# Patient Record
Sex: Female | Born: 1950 | Race: Black or African American | Hispanic: No | Marital: Single
Health system: Midwestern US, Community
[De-identification: ages and names within clinical notes are randomized; demographics above are authoritative.]

## PROBLEM LIST (undated history)

## (undated) DIAGNOSIS — F419 Anxiety disorder, unspecified: Secondary | ICD-10-CM

## (undated) DIAGNOSIS — Z9289 Personal history of other medical treatment: Secondary | ICD-10-CM

## (undated) DIAGNOSIS — F329 Major depressive disorder, single episode, unspecified: Secondary | ICD-10-CM

## (undated) DIAGNOSIS — M1990 Unspecified osteoarthritis, unspecified site: Secondary | ICD-10-CM

## (undated) DIAGNOSIS — I499 Cardiac arrhythmia, unspecified: Secondary | ICD-10-CM

## (undated) DIAGNOSIS — K219 Gastro-esophageal reflux disease without esophagitis: Secondary | ICD-10-CM

## (undated) DIAGNOSIS — IMO0001 Reserved for inherently not codable concepts without codable children: Secondary | ICD-10-CM

## (undated) DIAGNOSIS — I10 Essential (primary) hypertension: Secondary | ICD-10-CM

## (undated) DIAGNOSIS — F32A Depression, unspecified: Secondary | ICD-10-CM

## (undated) DIAGNOSIS — R413 Other amnesia: Secondary | ICD-10-CM

## (undated) HISTORY — PX: CARDIAC ELECTROPHYSIOLOGY STUDY AND ABLATION: SHX1294

## (undated) HISTORY — PX: CYST EXCISION: SHX5701

---

## 2015-03-29 ENCOUNTER — Other Ambulatory Visit: Payer: Self-pay | Admitting: Neurosurgery

## 2015-03-30 ENCOUNTER — Other Ambulatory Visit: Payer: Self-pay | Admitting: Neurosurgery

## 2015-04-17 ENCOUNTER — Encounter (HOSPITAL_COMMUNITY): Payer: Self-pay

## 2015-04-17 ENCOUNTER — Encounter (HOSPITAL_COMMUNITY)
Admission: RE | Admit: 2015-04-17 | Discharge: 2015-04-17 | Disposition: A | Discharge status: Home / Self Care | Payer: Medicare Other | Source: Ambulatory Visit | Attending: Neurosurgery | Admitting: Neurosurgery

## 2015-04-17 ENCOUNTER — Other Ambulatory Visit: Payer: Self-pay

## 2015-04-17 DIAGNOSIS — R220 Localized swelling, mass and lump, head: Secondary | ICD-10-CM | POA: Insufficient documentation

## 2015-04-17 DIAGNOSIS — F17200 Nicotine dependence, unspecified, uncomplicated: Secondary | ICD-10-CM | POA: Diagnosis not present

## 2015-04-17 DIAGNOSIS — I10 Essential (primary) hypertension: Secondary | ICD-10-CM | POA: Diagnosis not present

## 2015-04-17 DIAGNOSIS — Z01812 Encounter for preprocedural laboratory examination: Secondary | ICD-10-CM | POA: Insufficient documentation

## 2015-04-17 DIAGNOSIS — Z0183 Encounter for blood typing: Secondary | ICD-10-CM | POA: Insufficient documentation

## 2015-04-17 DIAGNOSIS — K219 Gastro-esophageal reflux disease without esophagitis: Secondary | ICD-10-CM | POA: Diagnosis not present

## 2015-04-17 DIAGNOSIS — Z01818 Encounter for other preprocedural examination: Secondary | ICD-10-CM | POA: Diagnosis not present

## 2015-04-17 DIAGNOSIS — Z79899 Other long term (current) drug therapy: Secondary | ICD-10-CM | POA: Insufficient documentation

## 2015-04-17 HISTORY — DX: Anxiety disorder, unspecified: F41.9

## 2015-04-17 HISTORY — DX: Gastro-esophageal reflux disease without esophagitis: K21.9

## 2015-04-17 HISTORY — DX: Major depressive disorder, single episode, unspecified: F32.9

## 2015-04-17 HISTORY — DX: Unspecified osteoarthritis, unspecified site: M19.90

## 2015-04-17 HISTORY — DX: Essential (primary) hypertension: I10

## 2015-04-17 HISTORY — DX: Personal history of other medical treatment: Z92.89

## 2015-04-17 HISTORY — DX: Cardiac arrhythmia, unspecified: I49.9

## 2015-04-17 HISTORY — DX: Depression, unspecified: F32.A

## 2015-04-17 HISTORY — DX: Reserved for inherently not codable concepts without codable children: IMO0001

## 2015-04-17 HISTORY — DX: Other amnesia: R41.3

## 2015-04-17 LAB — CBC
HEMATOCRIT: 44.7 % (ref 36.0–46.0)
HEMOGLOBIN: 14.9 g/dL (ref 12.0–15.0)
MCH: 30.1 pg (ref 26.0–34.0)
MCHC: 33.3 g/dL (ref 30.0–36.0)
MCV: 90.3 fL (ref 78.0–100.0)
PLATELETS: 314 10*3/uL (ref 150–400)
RBC: 4.95 MIL/uL (ref 3.87–5.11)
RDW: 13.7 % (ref 11.5–15.5)
WBC: 10 10*3/uL (ref 4.0–10.5)

## 2015-04-17 LAB — BASIC METABOLIC PANEL
Anion gap: 11 (ref 5–15)
BUN: 11 mg/dL (ref 6–20)
CO2: 25 mmol/L (ref 22–32)
CREATININE: 1 mg/dL (ref 0.44–1.00)
Calcium: 9.9 mg/dL (ref 8.9–10.3)
Chloride: 101 mmol/L (ref 101–111)
GFR calc non Af Amer: 58 mL/min — ABNORMAL LOW (ref >60)
GLUCOSE: 146 mg/dL — AB (ref 65–99)
Potassium: 4 mmol/L (ref 3.5–5.1)
Sodium: 137 mmol/L (ref 135–145)

## 2015-04-17 NOTE — Progress Notes (Addendum)
[name] reports that years ago that she had  a "place in her heart burnt to stop it from racing."  "It would speed up when I am anxious, it does not do it as much or last as long now."  Patient states that she may have experienced it a couple months ago, but it last less than 30 minutes, states she does have some shortness of breath with it.   Patient states that she does not see a cardiologist and that she had not seen a Dr in years until she fell and the tumor was found.  I requested records from Dr Lillia Dallas  And [facility].

## 2015-04-17 NOTE — Pre-Procedure Instructions (Signed)
    Kathy Tucker  04/17/2015       Your procedure is scheduled on Tuesday, August 16.  Report to [facility] Admitting at 5:30 A.M.                 Your surgery is scheduled for 7:30 A.M.   Call this number if you have problems the morning of surgery: [phone]                For any other questions, please call [phone], Monday - Friday 8 AM - 4 PM.   Remember:  Do not eat food or drink liquids after midnight Monday, August 15.  Take these medicines the morning of surgery with A SIP OF WATER : amLODipine (NORVASC) .              Stop taking Aspirin, Coumadin, Plavix, Effient and Herbal medications.  Don not take any NSAIDs ie: Ibuprofen,  Advil,Naproxen or any medication containing Aspirin (Goody Headache Powder).   Do not wear jewelry, make-up or nail polish.  Do not wear lotions, powders, or perfumes.    Do not shave 48 hours prior to surgery.   Do not bring valuables to the hospital.  [facility] is not responsible for any belongings or valuables.  Contacts, dentures or bridgework may not be worn into surgery.  Leave your suitcase in the car.  After surgery it may be brought to your room.  For patients admitted to the hospital, discharge time will be determined by your treatment team.  Special instructions:  Review  [place] - Preparing For Surgery.  Please read over the following fact sheets that you were given. Pain Booklet, Coughing and Deep Breathing, Blood Transfusion Information and Surgical Site Infection Prevention

## 2015-04-18 ENCOUNTER — Other Ambulatory Visit: Payer: Self-pay | Admitting: Neurosurgery

## 2015-04-18 DIAGNOSIS — R220 Localized swelling, mass and lump, head: Principal | ICD-10-CM

## 2015-04-18 DIAGNOSIS — G9389 Other specified disorders of brain: Secondary | ICD-10-CM

## 2015-04-18 LAB — ABO/RH: ABO/RH(D): O POS

## 2015-04-18 NOTE — Progress Notes (Signed)
Anesthesia Chart Review:  Pt is 64 year old female scheduled for R stereotactic frontotemporal craniotomy on 04/25/2015 with Dr. Kathyrn Sheriff.   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 Current smoker. BMI 28.  Medications include: amlodipine, lisinopril.   EKG 04/17/2015: NSR.   Nuclear stress test 11/08/2009: -The stress test is abnormal but not diagnostic for electrocardiographic evidence of myocardial ischemia. The ST depression seen was mild and <1.0 mm which is not diagnostic for ischemia. The stress test showed a pharmacological blood pressure response. Negative for inducement of arrhythmia. Other symptoms included: dyspnea consistent with Lexiscan infusion. -No significant perfusion defects. Normal LV perfusion. LV cavity size is normal.  -LVEF 69%. Normal wall motion.   Pt reported to PAT RN she sometimes still gets palpitations, last episode a couple of months ago. Has not seen cardiology in many years. Last cardiology notes in care everywhere indicate pt had occasional palpitations after ablation and that these were thought to be PACs or PVCs related to anxiety. Pt was referred to psychiatry.   If no changes and HR acceptable DOS, I anticipate pt can proceed with surgery as scheduled.    Willeen Cass, FNP-BC [facility]/Anesthesiology Phone: [phone] 04/18/2015 4:38 PM

## 2015-04-19 ENCOUNTER — Ambulatory Visit
Admission: RE | Admit: 2015-04-19 | Discharge: 2015-04-19 | Disposition: A | Discharge status: Home / Self Care | Payer: Medicare Other | Source: Ambulatory Visit | Attending: Neurosurgery | Admitting: Neurosurgery

## 2015-04-19 DIAGNOSIS — G9389 Other specified disorders of brain: Secondary | ICD-10-CM

## 2015-04-19 DIAGNOSIS — R220 Localized swelling, mass and lump, head: Principal | ICD-10-CM

## 2015-04-19 MED ORDER — GADOBENATE DIMEGLUMINE 529 MG/ML IV SOLN
16.0000 mL | Freq: Once | INTRAVENOUS | Status: AC | PRN
  Administered 2015-04-19: 16 mL via INTRAVENOUS

## 2015-04-21 ENCOUNTER — Ambulatory Visit
Admission: RE | Admit: 2015-04-21 | Discharge: 2015-04-21 | Disposition: A | Discharge status: Home / Self Care | Payer: Medicare Other | Source: Ambulatory Visit | Attending: Neurosurgery | Admitting: Neurosurgery

## 2015-04-24 MED ORDER — CEFAZOLIN SODIUM-DEXTROSE 2-3 GM-% IV SOLR
2.0000 g | INTRAVENOUS | Status: AC
  Administered 2015-04-25: 2 g via INTRAVENOUS
  Filled 2015-04-24: qty 50

## 2015-04-25 ENCOUNTER — Inpatient Hospital Stay (HOSPITAL_COMMUNITY): Payer: Medicare Other | Admitting: Emergency Medicine

## 2015-04-25 ENCOUNTER — Encounter (HOSPITAL_COMMUNITY): Payer: Self-pay | Admitting: *Deleted

## 2015-04-25 ENCOUNTER — Encounter (HOSPITAL_COMMUNITY)
Admission: RE | Disposition: A | Discharge status: Home / Self Care | Payer: Self-pay | Source: Ambulatory Visit | Attending: Neurosurgery

## 2015-04-25 ENCOUNTER — Inpatient Hospital Stay (HOSPITAL_COMMUNITY)
Admission: RE | Admit: 2015-04-25 | Discharge: 2015-05-01 | DRG: 026 | Disposition: A | Discharge status: Home / Self Care | Payer: Medicare Other | Source: Ambulatory Visit | Attending: Neurosurgery | Admitting: Neurosurgery

## 2015-04-25 ENCOUNTER — Inpatient Hospital Stay (HOSPITAL_COMMUNITY): Payer: Medicare Other | Admitting: Certified Registered Nurse Anesthetist

## 2015-04-25 DIAGNOSIS — E232 Diabetes insipidus: Secondary | ICD-10-CM | POA: Diagnosis not present

## 2015-04-25 DIAGNOSIS — G930 Cerebral cysts: Secondary | ICD-10-CM | POA: Diagnosis present

## 2015-04-25 DIAGNOSIS — E230 Hypopituitarism: Secondary | ICD-10-CM | POA: Diagnosis not present

## 2015-04-25 DIAGNOSIS — F17210 Nicotine dependence, cigarettes, uncomplicated: Secondary | ICD-10-CM | POA: Diagnosis present

## 2015-04-25 DIAGNOSIS — Z79899 Other long term (current) drug therapy: Secondary | ICD-10-CM | POA: Diagnosis not present

## 2015-04-25 DIAGNOSIS — F329 Major depressive disorder, single episode, unspecified: Secondary | ICD-10-CM | POA: Diagnosis present

## 2015-04-25 DIAGNOSIS — E871 Hypo-osmolality and hyponatremia: Secondary | ICD-10-CM | POA: Diagnosis not present

## 2015-04-25 DIAGNOSIS — I10 Essential (primary) hypertension: Secondary | ICD-10-CM | POA: Diagnosis present

## 2015-04-25 DIAGNOSIS — R519 Headache, unspecified: Secondary | ICD-10-CM | POA: Diagnosis present

## 2015-04-25 DIAGNOSIS — Z881 Allergy status to other antibiotic agents status: Secondary | ICD-10-CM

## 2015-04-25 DIAGNOSIS — Z888 Allergy status to other drugs, medicaments and biological substances status: Secondary | ICD-10-CM

## 2015-04-25 DIAGNOSIS — D444 Neoplasm of uncertain behavior of craniopharyngeal duct: Secondary | ICD-10-CM | POA: Diagnosis present

## 2015-04-25 DIAGNOSIS — Z791 Long term (current) use of non-steroidal anti-inflammatories (NSAID): Secondary | ICD-10-CM

## 2015-04-25 DIAGNOSIS — K219 Gastro-esophageal reflux disease without esophagitis: Secondary | ICD-10-CM | POA: Diagnosis present

## 2015-04-25 DIAGNOSIS — R51 Headache: Secondary | ICD-10-CM

## 2015-04-25 DIAGNOSIS — F419 Anxiety disorder, unspecified: Secondary | ICD-10-CM | POA: Diagnosis present

## 2015-04-25 DIAGNOSIS — R42 Dizziness and giddiness: Secondary | ICD-10-CM | POA: Diagnosis present

## 2015-04-25 HISTORY — PX: CRANIOTOMY: SHX93

## 2015-04-25 HISTORY — PX: APPLICATION OF CRANIAL NAVIGATION: SHX6578

## 2015-04-25 LAB — POCT I-STAT 7, (LYTES, BLD GAS, ICA,H+H)
ACID-BASE DEFICIT: 6 mmol/L — AB (ref 0.0–2.0)
Acid-base deficit: 4 mmol/L — ABNORMAL HIGH (ref 0.0–2.0)
BICARBONATE: 20.5 meq/L (ref 20.0–24.0)
BICARBONATE: 22 meq/L (ref 20.0–24.0)
Calcium, Ion: 1.14 mmol/L (ref 1.13–1.30)
Calcium, Ion: 1.17 mmol/L (ref 1.13–1.30)
HCT: 31 % — ABNORMAL LOW (ref 36.0–46.0)
HCT: 32 % — ABNORMAL LOW (ref 36.0–46.0)
HEMOGLOBIN: 10.5 g/dL — AB (ref 12.0–15.0)
Hemoglobin: 10.9 g/dL — ABNORMAL LOW (ref 12.0–15.0)
O2 SAT: 100 %
O2 Saturation: 100 %
PH ART: 7.279 — AB (ref 7.350–7.450)
PO2 ART: 319 mmHg — AB (ref 80.0–100.0)
PO2 ART: 274 mmHg — AB (ref 80.0–100.0)
Patient temperature: 36
Potassium: 3.4 mmol/L — ABNORMAL LOW (ref 3.5–5.1)
Potassium: 3.8 mmol/L (ref 3.5–5.1)
Sodium: 140 mmol/L (ref 135–145)
Sodium: 136 mmol/L (ref 135–145)
TCO2: 22 mmol/L (ref 0–100)
TCO2: 23 mmol/L (ref 0–100)
pCO2 arterial: 43.1 mmHg (ref 35.0–45.0)
pCO2 arterial: 39.1 mmHg (ref 35.0–45.0)
pH, Arterial: 7.354 (ref 7.350–7.450)

## 2015-04-25 LAB — POCT I-STAT 4, (NA,K, GLUC, HGB,HCT)
GLUCOSE: 139 mg/dL — AB (ref 65–99)
HCT: 34 % — ABNORMAL LOW (ref 36.0–46.0)
HEMOGLOBIN: 11.6 g/dL — AB (ref 12.0–15.0)
POTASSIUM: 4.3 mmol/L (ref 3.5–5.1)
Sodium: 139 mmol/L (ref 135–145)

## 2015-04-25 LAB — PREPARE RBC (CROSSMATCH)

## 2015-04-25 LAB — MRSA PCR SCREENING: MRSA BY PCR: NEGATIVE

## 2015-04-25 SURGERY — CRANIOTOMY TUMOR EXCISION
Anesthesia: General | Laterality: Right

## 2015-04-25 MED ORDER — SODIUM CHLORIDE 0.9 % IV SOLN
3000.0000 ug | INTRAVENOUS | Status: DC | PRN
  Administered 2015-04-25: .25 ug/kg/min via INTRAVENOUS

## 2015-04-25 MED ORDER — HEMOSTATIC AGENTS (NO CHARGE) OPTIME
TOPICAL | Status: DC | PRN
  Administered 2015-04-25: 1 via TOPICAL

## 2015-04-25 MED ORDER — ACETAMINOPHEN 10 MG/ML IV SOLN
INTRAVENOUS | Status: AC
  Administered 2015-04-25: 1000 mg via INTRAVENOUS
  Filled 2015-04-25: qty 100

## 2015-04-25 MED ORDER — ONDANSETRON HCL 4 MG/2ML IJ SOLN
INTRAMUSCULAR | Status: DC | PRN
  Administered 2015-04-25: 4 mg via INTRAVENOUS

## 2015-04-25 MED ORDER — THROMBIN 5000 UNITS EX SOLR
CUTANEOUS | Status: DC | PRN
  Administered 2015-04-25: 09:00:00 via TOPICAL

## 2015-04-25 MED ORDER — CEFAZOLIN SODIUM 1-5 GM-% IV SOLN
1.0000 g | Freq: Three times a day (TID) | INTRAVENOUS | Status: AC
  Administered 2015-04-26: 1 g via INTRAVENOUS
  Filled 2015-04-25 (×2): qty 50

## 2015-04-25 MED ORDER — HYDROMORPHONE HCL 1 MG/ML IJ SOLN
0.2500 mg | INTRAMUSCULAR | Status: DC | PRN
  Administered 2015-04-25 (×4): 0.5 mg via INTRAVENOUS

## 2015-04-25 MED ORDER — EPHEDRINE SULFATE 50 MG/ML IJ SOLN
INTRAMUSCULAR | Status: AC
  Filled 2015-04-25: qty 1

## 2015-04-25 MED ORDER — LIDOCAINE-EPINEPHRINE 1 %-1:100000 IJ SOLN
INTRAMUSCULAR | Status: DC | PRN
  Administered 2015-04-25: 5 mL

## 2015-04-25 MED ORDER — ONDANSETRON HCL 4 MG/2ML IJ SOLN
4.0000 mg | INTRAMUSCULAR | Status: DC | PRN
  Administered 2015-04-28: 4 mg via INTRAVENOUS
  Filled 2015-04-25: qty 2

## 2015-04-25 MED ORDER — NEOSTIGMINE METHYLSULFATE 10 MG/10ML IV SOLN
INTRAVENOUS | Status: AC
  Filled 2015-04-25: qty 1

## 2015-04-25 MED ORDER — LISINOPRIL 20 MG PO TABS
20.0000 mg | ORAL_TABLET | Freq: Every day | ORAL | Status: DC
  Administered 2015-04-25 – 2015-05-01 (×7): 20 mg via ORAL
  Filled 2015-04-25 (×7): qty 1

## 2015-04-25 MED ORDER — SENNA 8.6 MG PO TABS
1.0000 | ORAL_TABLET | Freq: Two times a day (BID) | ORAL | Status: DC
  Administered 2015-04-25 – 2015-05-01 (×12): 8.6 mg via ORAL
  Filled 2015-04-25 (×14): qty 1

## 2015-04-25 MED ORDER — ONDANSETRON HCL 4 MG/2ML IJ SOLN
INTRAMUSCULAR | Status: AC
  Filled 2015-04-25: qty 2

## 2015-04-25 MED ORDER — BUPIVACAINE HCL (PF) 0.5 % IJ SOLN
INTRAMUSCULAR | Status: DC | PRN
  Administered 2015-04-25: 5 mL

## 2015-04-25 MED ORDER — FAMOTIDINE 20 MG PO TABS
20.0000 mg | ORAL_TABLET | Freq: Once | ORAL | Status: DC
  Filled 2015-04-25: qty 1

## 2015-04-25 MED ORDER — PROPOFOL 10 MG/ML IV BOLUS
INTRAVENOUS | Status: AC
  Filled 2015-04-25: qty 20

## 2015-04-25 MED ORDER — MEPERIDINE HCL 25 MG/ML IJ SOLN: 6.2500 mg | INTRAMUSCULAR | Status: DC | PRN

## 2015-04-25 MED ORDER — EPHEDRINE SULFATE 50 MG/ML IJ SOLN
INTRAMUSCULAR | Status: DC | PRN
  Administered 2015-04-25 (×2): 5 mg via INTRAVENOUS

## 2015-04-25 MED ORDER — FENTANYL CITRATE (PF) 100 MCG/2ML IJ SOLN
INTRAMUSCULAR | Status: DC | PRN
  Administered 2015-04-25: 50 ug via INTRAVENOUS

## 2015-04-25 MED ORDER — HYDROCORTISONE NA SUCCINATE PF 100 MG IJ SOLR
50.0000 mg | Freq: Two times a day (BID) | INTRAMUSCULAR | Status: AC
  Administered 2015-04-25 – 2015-04-26 (×3): 50 mg via INTRAVENOUS
  Filled 2015-04-25 (×4): qty 1

## 2015-04-25 MED ORDER — MANNITOL 20 % IV SOLN
INTRAVENOUS | Status: DC | PRN
  Administered 2015-04-25: 09:00:00 via INTRAVENOUS

## 2015-04-25 MED ORDER — PHENYLEPHRINE HCL 10 MG/ML IJ SOLN
10.0000 mg | INTRAVENOUS | Status: DC | PRN
  Administered 2015-04-25: 25 ug/min via INTRAVENOUS

## 2015-04-25 MED ORDER — SODIUM CHLORIDE 0.9 % IV SOLN
INTRAVENOUS | Status: DC | PRN
  Administered 2015-04-25: 08:00:00 via INTRAVENOUS

## 2015-04-25 MED ORDER — LABETALOL HCL 5 MG/ML IV SOLN
10.0000 mg | INTRAVENOUS | Status: DC | PRN
  Administered 2015-04-25 (×3): 20 mg via INTRAVENOUS
  Filled 2015-04-25 (×3): qty 4

## 2015-04-25 MED ORDER — 0.9 % SODIUM CHLORIDE (POUR BTL) OPTIME
TOPICAL | Status: DC | PRN
  Administered 2015-04-25 (×2): 1000 mL

## 2015-04-25 MED ORDER — NEOSTIGMINE METHYLSULFATE 10 MG/10ML IV SOLN
INTRAVENOUS | Status: DC | PRN
  Administered 2015-04-25: 3 mg via INTRAVENOUS

## 2015-04-25 MED ORDER — PHENYLEPHRINE 40 MCG/ML (10ML) SYRINGE FOR IV PUSH (FOR BLOOD PRESSURE SUPPORT)
PREFILLED_SYRINGE | INTRAVENOUS | Status: AC
  Filled 2015-04-25: qty 10

## 2015-04-25 MED ORDER — BACITRACIN 50000 UNITS IM SOLR
INTRAMUSCULAR | Status: DC | PRN
  Administered 2015-04-25: 09:00:00

## 2015-04-25 MED ORDER — DOCUSATE SODIUM 100 MG PO CAPS
100.0000 mg | ORAL_CAPSULE | Freq: Two times a day (BID) | ORAL | Status: DC
  Administered 2015-04-25 – 2015-05-01 (×12): 100 mg via ORAL
  Filled 2015-04-25 (×13): qty 1

## 2015-04-25 MED ORDER — PANTOPRAZOLE SODIUM 40 MG IV SOLR
40.0000 mg | Freq: Every day | INTRAVENOUS | Status: DC
  Administered 2015-04-25: 40 mg via INTRAVENOUS
  Filled 2015-04-25: qty 40

## 2015-04-25 MED ORDER — SODIUM CHLORIDE 0.9 % IV SOLN
1000.0000 mg | INTRAVENOUS | Status: DC | PRN
  Administered 2015-04-25: 1000 mg via INTRAVENOUS

## 2015-04-25 MED ORDER — SUCCINYLCHOLINE CHLORIDE 20 MG/ML IJ SOLN
INTRAMUSCULAR | Status: AC
  Filled 2015-04-25: qty 1

## 2015-04-25 MED ORDER — GLYCOPYRROLATE 0.2 MG/ML IJ SOLN
INTRAMUSCULAR | Status: AC
  Filled 2015-04-25: qty 2

## 2015-04-25 MED ORDER — ROCURONIUM BROMIDE 50 MG/5ML IV SOLN
INTRAVENOUS | Status: AC
  Filled 2015-04-25: qty 1

## 2015-04-25 MED ORDER — LACTATED RINGERS IV SOLN: INTRAVENOUS | Status: DC

## 2015-04-25 MED ORDER — LIDOCAINE HCL (CARDIAC) 20 MG/ML IV SOLN
INTRAVENOUS | Status: DC | PRN
  Administered 2015-04-25: 80 mg via INTRAVENOUS

## 2015-04-25 MED ORDER — MORPHINE SULFATE (PF) 2 MG/ML IV SOLN
1.0000 mg | INTRAVENOUS | Status: DC | PRN
  Administered 2015-04-25 – 2015-04-26 (×9): 2 mg via INTRAVENOUS
  Filled 2015-04-25 (×10): qty 1

## 2015-04-25 MED ORDER — SODIUM CHLORIDE 0.9 % IV SOLN
Freq: Once | INTRAVENOUS | Status: AC
  Administered 2015-04-25 (×2): via INTRAVENOUS

## 2015-04-25 MED ORDER — LABETALOL HCL 5 MG/ML IV SOLN
INTRAVENOUS | Status: DC | PRN
Start: 2015-04-25 — End: 2015-04-25
  Administered 2015-04-25 (×4): 10 mg via INTRAVENOUS

## 2015-04-25 MED ORDER — ONDANSETRON HCL 4 MG PO TABS: 4.0000 mg | ORAL_TABLET | ORAL | Status: DC | PRN

## 2015-04-25 MED ORDER — GLYCOPYRROLATE 0.2 MG/ML IJ SOLN
INTRAMUSCULAR | Status: DC | PRN
  Administered 2015-04-25: 0.4 mg via INTRAVENOUS

## 2015-04-25 MED ORDER — PROMETHAZINE HCL 25 MG PO TABS
12.5000 mg | ORAL_TABLET | ORAL | Status: DC | PRN
  Administered 2015-04-30: 12.5 mg via ORAL
  Filled 2015-04-25: qty 1

## 2015-04-25 MED ORDER — ACETAMINOPHEN 160 MG/5ML PO SOLN
960.0000 mg | Freq: Once | ORAL | Status: DC
  Filled 2015-04-25: qty 30

## 2015-04-25 MED ORDER — MORPHINE SULFATE (PF) 2 MG/ML IV SOLN
2.0000 mg | Freq: Once | INTRAVENOUS | Status: AC
Start: 2015-04-25 — End: 2015-04-25
  Administered 2015-04-25: 2 mg via INTRAVENOUS

## 2015-04-25 MED ORDER — MECLIZINE HCL 12.5 MG PO TABS
12.5000 mg | ORAL_TABLET | Freq: Once | ORAL | Status: AC
  Administered 2015-04-25: 12.5 mg via ORAL
  Filled 2015-04-25: qty 1

## 2015-04-25 MED ORDER — FENTANYL CITRATE (PF) 250 MCG/5ML IJ SOLN
INTRAMUSCULAR | Status: AC
  Filled 2015-04-25: qty 5

## 2015-04-25 MED ORDER — HYDROMORPHONE HCL 1 MG/ML IJ SOLN
INTRAMUSCULAR | Status: AC
  Filled 2015-04-25: qty 1

## 2015-04-25 MED ORDER — BISACODYL 10 MG RE SUPP: 10.0000 mg | Freq: Every day | RECTAL | Status: DC | PRN

## 2015-04-25 MED ORDER — ROCURONIUM BROMIDE 50 MG/5ML IV SOLN
INTRAVENOUS | Status: AC
  Filled 2015-04-25: qty 2

## 2015-04-25 MED ORDER — SODIUM CHLORIDE 0.9 % IV SOLN
500.0000 mg | Freq: Two times a day (BID) | INTRAVENOUS | Status: DC
  Administered 2015-04-25 – 2015-04-27 (×4): 500 mg via INTRAVENOUS
  Filled 2015-04-25 (×5): qty 5

## 2015-04-25 MED ORDER — ROCURONIUM BROMIDE 100 MG/10ML IV SOLN
INTRAVENOUS | Status: DC | PRN
  Administered 2015-04-25 (×3): 20 mg via INTRAVENOUS
  Administered 2015-04-25: 10 mg via INTRAVENOUS
  Administered 2015-04-25: 40 mg via INTRAVENOUS
  Administered 2015-04-25: 10 mg via INTRAVENOUS
  Administered 2015-04-25: 20 mg via INTRAVENOUS

## 2015-04-25 MED ORDER — PROMETHAZINE HCL 25 MG/ML IJ SOLN: 6.2500 mg | INTRAMUSCULAR | Status: DC | PRN

## 2015-04-25 MED ORDER — THROMBIN 20000 UNITS EX SOLR
CUTANEOUS | Status: DC | PRN
  Administered 2015-04-25: 09:00:00 via TOPICAL

## 2015-04-25 MED ORDER — HYDROCODONE-ACETAMINOPHEN 5-325 MG PO TABS
1.0000 | ORAL_TABLET | ORAL | Status: DC | PRN
  Administered 2015-04-25 – 2015-04-30 (×15): 1 via ORAL
  Filled 2015-04-25 (×15): qty 1

## 2015-04-25 MED ORDER — AMLODIPINE BESYLATE 5 MG PO TABS
5.0000 mg | ORAL_TABLET | Freq: Every day | ORAL | Status: DC
  Administered 2015-04-25 – 2015-04-28 (×4): 5 mg via ORAL
  Filled 2015-04-25 (×4): qty 1

## 2015-04-25 MED ORDER — BACITRACIN ZINC 500 UNIT/GM EX OINT
TOPICAL_OINTMENT | CUTANEOUS | Status: DC | PRN
  Administered 2015-04-25 (×2): 1 via TOPICAL

## 2015-04-25 MED ORDER — PROPOFOL 10 MG/ML IV BOLUS
INTRAVENOUS | Status: DC | PRN
  Administered 2015-04-25: 200 mg via INTRAVENOUS

## 2015-04-25 MED ORDER — HYDRALAZINE HCL 20 MG/ML IJ SOLN
INTRAMUSCULAR | Status: AC
  Filled 2015-04-25: qty 1

## 2015-04-25 MED ORDER — ACETAMINOPHEN 500 MG PO TABS: 1000.0000 mg | ORAL_TABLET | Freq: Once | ORAL | Status: DC

## 2015-04-25 MED ORDER — NICARDIPINE HCL IN NACL 20-0.86 MG/200ML-% IV SOLN
3.0000 mg/h | INTRAVENOUS | Status: DC
  Administered 2015-04-25 (×2): 7.5 mg/h via INTRAVENOUS
  Administered 2015-04-25: 5 mg/h via INTRAVENOUS
  Administered 2015-04-26: 10 mg/h via INTRAVENOUS
  Administered 2015-04-26: 7.5 mg/h via INTRAVENOUS
  Administered 2015-04-26: 10 mg/h via INTRAVENOUS
  Filled 2015-04-25 (×7): qty 200

## 2015-04-25 MED ORDER — SODIUM CHLORIDE 0.9 % IV SOLN
INTRAVENOUS | Status: DC
  Administered 2015-04-25 – 2015-04-26 (×2): via INTRAVENOUS

## 2015-04-25 MED ORDER — DEXAMETHASONE SODIUM PHOSPHATE 10 MG/ML IJ SOLN
INTRAMUSCULAR | Status: DC | PRN
  Administered 2015-04-25: 10 mg via INTRAVENOUS

## 2015-04-25 MED ORDER — LIDOCAINE HCL (CARDIAC) 20 MG/ML IV SOLN
INTRAVENOUS | Status: AC
  Filled 2015-04-25: qty 5

## 2015-04-25 MED ORDER — MORPHINE SULFATE (PF) 2 MG/ML IV SOLN
INTRAVENOUS | Status: AC
  Filled 2015-04-25: qty 1

## 2015-04-25 MED ORDER — HYDRALAZINE HCL 20 MG/ML IJ SOLN
10.0000 mg | INTRAMUSCULAR | Status: DC
  Administered 2015-04-25 (×2): 10 mg via INTRAVENOUS

## 2015-04-25 MED ORDER — SODIUM CHLORIDE 0.9 % IJ SOLN
INTRAMUSCULAR | Status: AC
  Filled 2015-04-25: qty 10

## 2015-04-25 SURGICAL SUPPLY — 106 items
BAG DECANTER FOR FLEXI CONT (MISCELLANEOUS) ×3 IMPLANT
BANDAGE GAUZE 4  KLING STR (GAUZE/BANDAGES/DRESSINGS) ×3 IMPLANT
BATTERY IQ STERILE (MISCELLANEOUS) ×3 IMPLANT
BENZOIN TINCTURE PRP APPL 2/3 (GAUZE/BANDAGES/DRESSINGS) IMPLANT
BLADE CLIPPER SURG (BLADE) ×6 IMPLANT
BLADE SAW GIGLI 16 STRL (MISCELLANEOUS) IMPLANT
BLADE SURG 15 STRL LF DISP TIS (BLADE) ×1 IMPLANT
BLADE SURG 15 STRL SS (BLADE) ×2
BLADE ULTRA TIP 2M (BLADE) ×3 IMPLANT
BNDG GAUZE ELAST 4 BULKY (GAUZE/BANDAGES/DRESSINGS) ×3 IMPLANT
BRUSH SCRUB EZ 1% IODOPHOR (MISCELLANEOUS) IMPLANT
BUR ACORN 6.0 PRECISION (BURR) ×2 IMPLANT
BUR ACORN 6.0MM PRECISION (BURR) ×1
BUR ADDG 1.1 (BURR) IMPLANT
BUR ADDG 1.1MM (BURR)
BUR MATCHSTICK NEURO 3.0 LAGG (BURR) IMPLANT
BUR ROUND FLUTED 4 SOFT TCH (BURR) ×2 IMPLANT
BUR ROUND FLUTED 4MM SOFT TCH (BURR) ×1
BUR SPIRAL ROUTER 2.3 (BUR) ×2 IMPLANT
BUR SPIRAL ROUTER 2.3MM (BUR) ×1
CANISTER SUCT 3000ML PPV (MISCELLANEOUS) ×6 IMPLANT
CATH VENTRIC 35X38 W/TROCAR LG (CATHETERS) IMPLANT
CLIP TI MEDIUM 6 (CLIP) IMPLANT
CONT SPEC 4OZ CLIKSEAL STRL BL (MISCELLANEOUS) ×6 IMPLANT
COVER MAYO STAND STRL (DRAPES) IMPLANT
DECANTER SPIKE VIAL GLASS SM (MISCELLANEOUS) ×3 IMPLANT
DRAIN SNY WOU 7FLT (WOUND CARE) IMPLANT
DRAIN SUBARACHNOID (WOUND CARE) IMPLANT
DRAPE MICROSCOPE LEICA (MISCELLANEOUS) ×6 IMPLANT
DRAPE NEUROLOGICAL W/INCISE (DRAPES) ×3 IMPLANT
DRAPE PROXIMA HALF (DRAPES) ×3 IMPLANT
DRAPE STERI IOBAN 125X83 (DRAPES) IMPLANT
DRAPE SURG 17X23 STRL (DRAPES) IMPLANT
DRAPE WARM FLUID 44X44 (DRAPE) ×3 IMPLANT
DRSG ADAPTIC 3X8 NADH LF (GAUZE/BANDAGES/DRESSINGS) ×3 IMPLANT
DRSG TELFA 3X8 NADH (GAUZE/BANDAGES/DRESSINGS) ×3 IMPLANT
DURAMATRIX ONLAY 3X3 (Plate) ×3 IMPLANT
DURAPREP 6ML APPLICATOR 50/CS (WOUND CARE) ×3 IMPLANT
ELECT CAUTERY BLADE 6.4 (BLADE) ×3 IMPLANT
ELECT REM PT RETURN 9FT ADLT (ELECTROSURGICAL) ×3
ELECTRODE REM PT RTRN 9FT ADLT (ELECTROSURGICAL) ×1 IMPLANT
EVACUATOR 1/8 PVC DRAIN (DRAIN) IMPLANT
EVACUATOR SILICONE 100CC (DRAIN) IMPLANT
FORCEPS BIPOLAR SPETZLER 8 1.0 (NEUROSURGERY SUPPLIES) ×3 IMPLANT
GAUZE SPONGE 4X4 12PLY STRL (GAUZE/BANDAGES/DRESSINGS) ×3 IMPLANT
GAUZE SPONGE 4X4 16PLY XRAY LF (GAUZE/BANDAGES/DRESSINGS) IMPLANT
GLOVE ECLIPSE 6.5 STRL STRAW (GLOVE) ×3 IMPLANT
GLOVE ECLIPSE 7.5 STRL STRAW (GLOVE) IMPLANT
GLOVE ECLIPSE 9.0 STRL (GLOVE) ×3 IMPLANT
GLOVE EXAM NITRILE LRG STRL (GLOVE) IMPLANT
GLOVE EXAM NITRILE MD LF STRL (GLOVE) IMPLANT
GLOVE EXAM NITRILE XL STR (GLOVE) IMPLANT
GLOVE EXAM NITRILE XS STR PU (GLOVE) IMPLANT
GOWN STRL REUS W/ TWL LRG LVL3 (GOWN DISPOSABLE) ×2 IMPLANT
GOWN STRL REUS W/ TWL XL LVL3 (GOWN DISPOSABLE) ×3 IMPLANT
GOWN STRL REUS W/TWL 2XL LVL3 (GOWN DISPOSABLE) IMPLANT
GOWN STRL REUS W/TWL LRG LVL3 (GOWN DISPOSABLE) ×4
GOWN STRL REUS W/TWL XL LVL3 (GOWN DISPOSABLE) ×6
HEMOSTAT POWDER KIT SURGIFOAM (HEMOSTASIS) IMPLANT
HEMOSTAT POWDER SURGIFOAM 1G (HEMOSTASIS) ×3 IMPLANT
HEMOSTAT SURGICEL 2X14 (HEMOSTASIS) IMPLANT
KIT BASIN OR (CUSTOM PROCEDURE TRAY) ×3 IMPLANT
KIT DRAIN CSF ACCUDRAIN (MISCELLANEOUS) IMPLANT
KIT ROOM TURNOVER OR (KITS) ×3 IMPLANT
KNIFE ARACHNOID DISP AM-24-S (MISCELLANEOUS) ×3 IMPLANT
MARKER SPHERE PSV REFLC 13MM (MARKER) ×12 IMPLANT
NEEDLE HYPO 25X1 1.5 SAFETY (NEEDLE) ×3 IMPLANT
NEEDLE SPNL 18GX3.5 QUINCKE PK (NEEDLE) IMPLANT
NS IRRIG 1000ML POUR BTL (IV SOLUTION) ×6 IMPLANT
PACK CRANIOTOMY (CUSTOM PROCEDURE TRAY) ×3 IMPLANT
PAD EYE OVAL STERILE LF (GAUZE/BANDAGES/DRESSINGS) IMPLANT
PATTIES SURGICAL .25X.25 (GAUZE/BANDAGES/DRESSINGS) ×3 IMPLANT
PATTIES SURGICAL .5 X.5 (GAUZE/BANDAGES/DRESSINGS) ×6 IMPLANT
PATTIES SURGICAL .5 X1 (DISPOSABLE) ×3 IMPLANT
PATTIES SURGICAL .5 X3 (DISPOSABLE) IMPLANT
PATTIES SURGICAL 1/4 X 3 (GAUZE/BANDAGES/DRESSINGS) IMPLANT
PATTIES SURGICAL 1X1 (DISPOSABLE) IMPLANT
PIN MAYFIELD SKULL DISP (PIN) ×3 IMPLANT
PLATE 1.5  2HOLE LNG NEURO (Plate) ×4 IMPLANT
PLATE 1.5 2HOLE LNG NEURO (Plate) ×2 IMPLANT
PLATE 1.5 4HOLE LONG STRAIGHT (Plate) ×3 IMPLANT
RUBBERBAND STERILE (MISCELLANEOUS) IMPLANT
SCREW SELF DRILL HT 1.5/4MM (Screw) ×18 IMPLANT
SPECIMEN JAR SMALL (MISCELLANEOUS) IMPLANT
SPONGE NEURO XRAY DETECT 1X3 (DISPOSABLE) IMPLANT
SPONGE SURGIFOAM ABS GEL 100 (HEMOSTASIS) ×3 IMPLANT
STAPLER VISISTAT 35W (STAPLE) ×6 IMPLANT
STOCKINETTE ×6 IMPLANT
STOCKINETTE 6  STRL (DRAPES)
STOCKINETTE 6 STRL (DRAPES) IMPLANT
SUT ETHILON 3 0 FSL (SUTURE) IMPLANT
SUT ETHILON 3 0 PS 1 (SUTURE) IMPLANT
SUT NURALON 4 0 TR CR/8 (SUTURE) ×6 IMPLANT
SUT SILK 0 TIES 10X30 (SUTURE) IMPLANT
SUT VIC AB 0 CT1 18XCR BRD8 (SUTURE) ×2 IMPLANT
SUT VIC AB 0 CT1 8-18 (SUTURE) ×4
SUT VIC AB 3-0 SH 8-18 (SUTURE) ×6 IMPLANT
SYR CONTROL 10ML LL (SYRINGE) ×3 IMPLANT
TIP SONASTAR STD MISONIX 1.9 (TRAY / TRAY PROCEDURE) IMPLANT
TOWEL OR 17X24 6PK STRL BLUE (TOWEL DISPOSABLE) ×3 IMPLANT
TOWEL OR 17X26 10 PK STRL BLUE (TOWEL DISPOSABLE) ×3 IMPLANT
TRAY FOLEY W/METER SILVER 14FR (SET/KITS/TRAYS/PACK) ×3 IMPLANT
TUBE CONNECTING 12'X1/4 (SUCTIONS) ×1
TUBE CONNECTING 12X1/4 (SUCTIONS) ×2 IMPLANT
UNDERPAD 30X30 INCONTINENT (UNDERPADS AND DIAPERS) ×3 IMPLANT
WATER STERILE IRR 1000ML POUR (IV SOLUTION) ×3 IMPLANT

## 2015-04-25 NOTE — Transfer of Care (Signed)
Immediate Anesthesia Transfer of Care Note  Patient: Kathy Tucker  Procedure(s) Performed: Procedure(s) with comments: Right stereotactic frontotemporal craniotomy for Resection of Tumor (Right) - Right stereotactic frontotemporal craniotomy for resection of tumor with brain lab APPLICATION OF CRANIAL NAVIGATION (Right)  Patient Location: PACU  Anesthesia Type:General  Level of Consciousness: awake, alert , oriented and patient cooperative  Airway & Oxygen Therapy: Patient Spontanous Breathing and Patient connected to face mask oxygen  Post-op Assessment: Report given to RN, Post -op Vital signs reviewed and stable and Patient moving all extremities X 4  Post vital signs: Reviewed and stable  Last Vitals:  Filed Vitals:   04/25/15 0610  BP: 153/89  Pulse: 66  Temp: 37 C  Resp: 16    Complications: No apparent anesthesia complications

## 2015-04-25 NOTE — Anesthesia Preprocedure Evaluation (Addendum)
Anesthesia Evaluation  Patient identified by MRN, date of birth, ID band Patient awake    Reviewed: Allergy & Precautions, NPO status , Patient's Chart, lab work & pertinent test results  Airway Mallampati: II  TM Distance: >3 FB Neck ROM: Full    Dental  (+) Teeth Intact, Dental Advisory Given   Pulmonary Current Smoker,  breath sounds clear to auscultation        Cardiovascular Exercise Tolerance: Good hypertension, Pt. on medications Rhythm:Regular Rate:Normal     Neuro/Psych PSYCHIATRIC DISORDERS Anxiety Depression    GI/Hepatic Neg liver ROS, GERD-  ,  Endo/Other  negative endocrine ROS  Renal/GU negative Renal ROS  negative genitourinary   Musculoskeletal  (+) Arthritis -, Osteoarthritis,    Abdominal   Peds negative pediatric ROS (+)  Hematology negative hematology ROS (+)   Anesthesia Other Findings   Reproductive/Obstetrics negative OB ROS                            Lab Results  Component Value Date   WBC 10.0 04/17/2015   HGB 14.9 04/17/2015   HCT 44.7 04/17/2015   MCV 90.3 04/17/2015   PLT 314 04/17/2015   Lab Results  Component Value Date   CREATININE 1.00 04/17/2015   BUN 11 04/17/2015   NA 137 04/17/2015   K 4.0 04/17/2015   CL 101 04/17/2015   CO2 25 04/17/2015   No results found for: INR, PROTIME  EKG: normal sinus rhythm.  Anesthesia Physical Anesthesia Plan  ASA: II  Anesthesia Plan: General   Post-op Pain Management:    Induction: Intravenous  Airway Management Planned: Oral ETT  Additional Equipment: Arterial line  Intra-op Plan:   Post-operative Plan: Extubation in OR  Informed Consent: I have reviewed the patients History and Physical, chart, labs and discussed the procedure including the risks, benefits and alternatives for the proposed anesthesia with the patient or authorized representative who has indicated his/her understanding and  acceptance.   Dental advisory given  Plan Discussed with: CRNA  Anesthesia Plan Comments:         Anesthesia Quick Evaluation

## 2015-04-25 NOTE — Op Note (Signed)
PREOP DIAGNOSIS:  1. Supraseallar tumor   POSTOP DIAGNOSIS: Same  PROCEDURE: 1. Stereotactic right frontotemporal craniotomy for resection of suprasellar tumor 2. Use of intraoperative microscope for microdissection  SURGEON: Dr. Consuella Lose, MD  ASSISTANT: Dr. Janece Canterbury, MD  ANESTHESIA: General Endotracheal  EBL: 250cc  SPECIMENS: Hypothalamic tumor for frozen/permanent pathology  DRAINS: None  COMPLICATIONS: None immediate  CONDITION: Hemodynamically stable to PACU  HISTORY: Kathy Tucker is a 64 y.o. female who initially presented to the outpatient neurosurgery clinic with nonspecific complaints of dizziness and headache, with imbalance. 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 Endocrine workup was also completed which showed mild elevation of serum prolactin, but no other endocrine abnormalities. Diagnosis was unclear, but included craniopharyngioma, cystic adenoma, and Diovan cephalic glioma. Surgical resection for decompression and diagnosis was therefore indicated. Risks and benefits of the surgery were reviewed in detail with the patient and her family, including endocrine abnormality, seizure, coma, visual loss, were all reviewed. After all questions were answered, informed consent was obtained.  PROCEDURE IN DETAIL: After informed consent was obtained and witnessed, the patient was brought to the operating room. After induction of general anesthesia, the Mayfield head holder was applied to the patient, and she was positioned on the operative table in the supine position. All pressure points were meticulously padded. Skin incision was then marked out and prepped and draped in the usual sterile fashion. Utilizing the preoperative stereotactic MRI scan, surface markers were co-registered until satisfactory accuracy was achieved.  After timeout was conducted, skin incision was infiltrated with local  and static with epinephrine. Skin incision was then made sharply, and Bovie electrocautery was used to dissect the subcutaneous tissue until the galea was identified and incised. The superficial temporal fascia, and the fat pad was then identified and incised, and skin flap was elevated after interfascial temporal dissection was completed. Skin flap was then retracted anteriorly. Temporalis fascia was then incised, and the muscle retracted inferiorly. Standard frontotemporal craniotomy was completed and elevated. Good hemostasis was achieved on the bone edges. Utilizing a 4 mm cutting bur, the lesser wing of the sphenoid was then drilled down flush with the orbital roof. At this point, the microscope was draped sterilely and brought into the field, and the remainder of the case was done under the microscope using microdissection.  The dura was opened in curvilinear fashion and retracted anteriorly. Utilizing a subfrontal approach, the right optic nerve was identified, and the optical carotid cistern was opened. Good CSF egress was obtained, and this allowed significant brain relaxation. We are then able to dissect out the carotid artery to its bifurcation, and the medial portion of the sylvian fissure was identified, and the proximal M1 was also identified. Dissection was then carried out on top of the optic nerve to identify the right A1, and this was traced medially, until the interhemispheric fissure was identified. The contralateral A1 was then identified, as was the contralateral optic nerve and internal carotid artery. Dissection was then carried out along the superior aspect of the optic chiasm. The interhemispheric fissure was then split, the right frontal lobe was slightly retracted, and the lamina terminalis was identified underneath the anterior communicating artery complex. Our position was verified utilizing the stereotactic system. The lamina terminalis was then coagulated, and a dissector was used  to fenestrate the underlying tissue. There was a gush of clear fluid which appeared to be CSF. Inspection within the cavity  appeared to be cyst wall, and we're therefore within the cystic portion of the tumor. Inferiorly, there was a pedunculated whitish-colored mass, which what appeared to be small areas of calcification. Portions of this mass were removed with pituitary rongeurs and sent for frozen pathology. Pathology did return consistent with benign squamous type tumor, consistent with craniopharyngioma. Utilizing multiple microdissectors, portions of the solid pedunculated mass were removed piecemeal utilizing rongeur The cyst wall was noted to be somewhat adherent to the Hypothalamus, and it was therefore left in place.   having completed safe resection of the enhancing portion of the tumor, the region was irrigated with copious amounts of normal saline irrigation. Good hemostasis was confirmed on the brain surface. The dura was reapproximated a combination of interrupted 4-0 Nurolon stitches. A piece of dura matrix was then placed over the dural surface. Muscle was then closed using interrupted 0 Vicryl stitches, and the galea was closed using interrupted 3-0 Vicryl sutures. The skin was closed using standard surgical skin staples. Sterile dressing was then applied after the Mayfield head holder was removed. The patient was then transferred to the stretcher and taken to thepostanesthesia care unitable hemodynamic condition.  At the end of the case all sponge, needle, cottonoid, and instrument counts were correct.

## 2015-04-25 NOTE — H&P (Signed)
CC:  Falls, dizziness  HPI: Kathy Tucker is a 64 y.o. female initially seen in the office with   imbalance and dizziness since the beginning of this year. She says that her first fall was in January, where she felt dizzy and nearly passed out, and fell hitting her left side. She did okay for several months, and had another fall a few weeks ago, and another one a few weeks after that. She says these episodes are caused by bouts of dizziness and lightheadedness. Upon questioning, she says that she is also experienced significant weight gain of about 20 or 30 pounds since the beginning of this year. She has had fairly chronic headaches, although these have not changed significantly. She has not noted any changes in her vision.   PMH: Past Medical History  Diagnosis Date  . Hypertension   . Dysrhythmia     heart rate speeds up, usually with anixety  . Shortness of breath dyspnea     with exertion  . Anxiety   . Depression   . Short-term memory loss     Past 8 months  . GERD (gastroesophageal reflux disease)     has taken Zantac- not recently  . Arthritis   . History of blood transfusion     after csection    PSH: Past Surgical History  Procedure Laterality Date  . Cesarean section      x 3  . Cyst excision Right     from hand  . Cardiac electrophysiology study and ablation      in [place] ? 2008 ish    SH: Social History  Substance Use Topics  . Smoking status: Current Every Day Smoker -- 1.00 packs/day for 44 years  . Smokeless tobacco: None  . Alcohol Use: No    MEDS: Prior to Admission medications   Medication Sig Start Date End Date Taking? Authorizing Provider  amLODipine (NORVASC) 10 MG tablet Take 5 mg by mouth daily.    Yes Historical Provider, MD  Aspirin-Acetaminophen-Caffeine (GOODY HEADACHE PO) Take 1 packet by mouth as needed (for pain).   Yes Historical Provider, MD  ibuprofen (ADVIL,MOTRIN) 400 MG tablet Take 400 mg by mouth every 6 (six) hours as  needed for headache.   Yes Historical Provider, MD  lisinopril (PRINIVIL,ZESTRIL) 20 MG tablet Take 20 mg by mouth daily.   Yes Historical Provider, MD    ALLERGY: Allergies  Allergen Reactions  . Sulfa Antibiotics Rash    Hives  . Bupropion   . Citalopram Hydrobromide   . Fluoxetine Hcl     ROS: ROS  NEUROLOGIC EXAM: Awake, alert, oriented Memory and concentration grossly intact Speech fluent, appropriate CN grossly intact Motor exam: Upper Extremities Deltoid Bicep Tricep Grip  Right 5/5 5/5 5/5 5/5  Left 5/5 5/5 5/5 5/5   Lower Extremity IP Quad PF DF EHL  Right 5/5 5/5 5/5 5/5 5/5  Left 5/5 5/5 5/5 5/5 5/5   Sensation grossly intact to LT  DIAGNOSTIC RESULTS  MRI of the brain with and without contrast dated 03/07/15 was reviewed. This demonstrates a somewhat heterogeneously enhancing mass which appears to arise from the superior aspect of the pituitary stalk, with an associated cyst which extends into the third ventricle. The wall of the cyst does enhance with contrast. Review of the CT scan does not demonstrate any calcifications of this lesion. There is no hydrocephalus.   Her serum endocrine panel was reviewed and is essentially normal, with the exception of a  slightly elevated prolactin at 57.9.  IMPRESSION: 64 year old woman with what appears to be a cystic mass of the infundibulum. Differential includes cystic lesion such as Rathke's cleft cyst, or craniopharyngioma, although I believe cystic-type glioma is also within the differential. Her elevated prolactin is likely from stalk effect.  PLAN: - proceed with right sided frontotemporal craniotomy for excisional biopsy and cyst fenestration with the goal being diagnosis, and decompression.   I think it unlikely that this tumor can be completely removed, nor can the wall be completely removed given its location in the third ventricle/hypothalamus.   I did review the MRI results with the patient and her family.  Treatment options were discussed including my recommendation for surgery to obtain a diagnosis. Risks of surgery were discussed including the risk of optic nerve injury, endocrine dysfunction, hypothalamic dysfunction which can lead to coma or death, stroke, seizure, and hydrocephalus. The goals of surgery were also discussed. After all the patient's and her family's questions were answered, they elected to proceed with surgery.

## 2015-04-25 NOTE — Anesthesia Procedure Notes (Signed)
Procedure Name: Intubation Date/Time: 04/25/2015 7:45 AM Performed by: Marye Round Pre-anesthesia Checklist: Patient identified, Emergency Drugs available, Suction available and Patient being monitored Patient Re-evaluated:Patient Re-evaluated prior to inductionOxygen Delivery Method: Circle system utilized Preoxygenation: Pre-oxygenation with 100% oxygen Intubation Type: IV induction Ventilation: Oral airway inserted - appropriate to patient size and Mask ventilation without difficulty Laryngoscope Size: Glidescope and 3 Grade View: Grade I Tube type: Oral Tube size: 7.5 mm Number of attempts: 2 Airway Equipment and Method: Stylet Placement Confirmation: ETT inserted through vocal cords under direct vision,  positive ETCO2 and breath sounds checked- equal and bilateral Secured at: 22 cm Tube secured with: Tape Dental Injury: Teeth and Oropharynx as per pre-operative assessment  Difficulty Due To: Difficulty was anticipated Comments: DLx2 with Grade 3 view by CRNA, Glidescope utilized (Grade 1 view equivalent). Patient epiglottis was anterior with small mouth opening. Easy BMV throughout. Teeth block in place throughout. Dentition unchanged.   Recommend short acting and glidescope for future intubations.   Crissie Sickles Smith Robert, MD, [facility] Anesthesiology

## 2015-04-25 NOTE — Anesthesia Postprocedure Evaluation (Signed)
  Anesthesia Post-op Note  Patient: Kathy Tucker  Procedure(s) Performed: Procedure(s) with comments: Right stereotactic frontotemporal craniotomy for Resection of Tumor (Right) - Right stereotactic frontotemporal craniotomy for resection of tumor with brain lab APPLICATION OF CRANIAL NAVIGATION (Right)  Patient Location: PACU  Anesthesia Type:General  Level of Consciousness: awake, alert  and oriented  Airway and Oxygen Therapy: Patient Spontanous Breathing  Post-op Pain: mild  Post-op Assessment: Post-op Vital signs reviewed and Patient's Cardiovascular Status Stable              Post-op Vital Signs: Reviewed and stable  Last Vitals:  Filed Vitals:   04/25/15 1313  BP: 111/61  Pulse: 75  Temp:   Resp: 16    Complications: No apparent anesthesia complications

## 2015-04-25 NOTE — Progress Notes (Signed)
Utilization review completed. Houston Zapien, RN, BSN. 

## 2015-04-26 ENCOUNTER — Inpatient Hospital Stay (HOSPITAL_COMMUNITY): Payer: Medicare Other

## 2015-04-26 ENCOUNTER — Encounter (HOSPITAL_COMMUNITY): Payer: Self-pay | Admitting: Neurosurgery

## 2015-04-26 LAB — BASIC METABOLIC PANEL WITH GFR
Anion gap: 13 (ref 5–15)
BUN: <5 mg/dL — ABNORMAL LOW (ref 6–20)
CO2: 17 mmol/L — ABNORMAL LOW (ref 22–32)
Calcium: 9 mg/dL (ref 8.9–10.3)
Chloride: 114 mmol/L — ABNORMAL HIGH (ref 101–111)
Creatinine, Ser: 0.76 mg/dL (ref 0.44–1.00)
GFR calc Af Amer: >60 mL/min
GFR calc non Af Amer: >60 mL/min
Glucose, Bld: 153 mg/dL — ABNORMAL HIGH (ref 65–99)
Potassium: 3.6 mmol/L (ref 3.5–5.1)
Sodium: 144 mmol/L (ref 135–145)

## 2015-04-26 LAB — BASIC METABOLIC PANEL
Anion gap: 12 (ref 5–15)
CALCIUM: 9.2 mg/dL (ref 8.9–10.3)
CO2: 21 mmol/L — ABNORMAL LOW (ref 22–32)
Chloride: 117 mmol/L — ABNORMAL HIGH (ref 101–111)
Creatinine, Ser: 0.73 mg/dL (ref 0.44–1.00)
GFR calc Af Amer: >60 mL/min (ref >60)
GLUCOSE: 155 mg/dL — AB (ref 65–99)
Potassium: 3.3 mmol/L — ABNORMAL LOW (ref 3.5–5.1)
Sodium: 150 mmol/L — ABNORMAL HIGH (ref 135–145)

## 2015-04-26 LAB — GLUCOSE, CAPILLARY
Glucose-Capillary: 150 mg/dL — ABNORMAL HIGH (ref 65–99)
Glucose-Capillary: 175 mg/dL — ABNORMAL HIGH (ref 65–99)

## 2015-04-26 LAB — SODIUM: Sodium: 148 mmol/L — ABNORMAL HIGH (ref 135–145)

## 2015-04-26 MED ORDER — MORPHINE SULFATE (PF) 2 MG/ML IV SOLN
1.0000 mg | INTRAVENOUS | Status: DC | PRN
  Administered 2015-04-26 – 2015-04-30 (×12): 2 mg via INTRAVENOUS
  Filled 2015-04-26 (×13): qty 1

## 2015-04-26 MED ORDER — OXYCODONE HCL 5 MG PO TABS
10.0000 mg | ORAL_TABLET | ORAL | Status: DC | PRN
  Administered 2015-04-26 – 2015-05-01 (×19): 10 mg via ORAL
  Filled 2015-04-26 (×19): qty 2

## 2015-04-26 MED ORDER — MIDAZOLAM HCL 2 MG/2ML IJ SOLN
INTRAMUSCULAR | Status: AC
  Filled 2015-04-26: qty 2

## 2015-04-26 MED ORDER — SODIUM CHLORIDE 0.45 % IV SOLN
INTRAVENOUS | Status: DC
  Administered 2015-04-26 – 2015-04-27 (×2): via INTRAVENOUS

## 2015-04-26 MED ORDER — NICARDIPINE HCL IN NACL 40-0.83 MG/200ML-% IV SOLN
3.0000 mg/h | INTRAVENOUS | Status: DC
Start: 2015-04-26 — End: 2015-05-01
  Filled 2015-04-26: qty 200

## 2015-04-26 MED ORDER — GADOBENATE DIMEGLUMINE 529 MG/ML IV SOLN: 16.0000 mL | Freq: Once | INTRAVENOUS | Status: DC | PRN

## 2015-04-26 MED ORDER — PANTOPRAZOLE SODIUM 40 MG PO TBEC
40.0000 mg | DELAYED_RELEASE_TABLET | Freq: Every day | ORAL | Status: DC
  Administered 2015-04-26 – 2015-04-30 (×5): 40 mg via ORAL
  Filled 2015-04-26 (×5): qty 1

## 2015-04-26 MED ORDER — MIDAZOLAM HCL 2 MG/2ML IJ SOLN
1.0000 mg | INTRAMUSCULAR | Status: DC | PRN
Start: 2015-04-26 — End: 2015-05-01
  Administered 2015-04-26 – 2015-04-28 (×4): 2 mg via INTRAVENOUS
  Filled 2015-04-26 (×3): qty 2

## 2015-04-26 NOTE — Progress Notes (Signed)
Pt seen and examined. Pt did have increased urine output overnight. C/O HA this am.  EXAM: Temp:  [96.7 F (35.9 C)-98.3 F (36.8 C)] 97.9 F (36.6 C) (08/17 1132) Pulse Rate:  [62-123] 101 (08/17 1100) Resp:  [11-25] 17 (08/17 1100) BP: (72-185)/(42-111) 85/42 mmHg (08/17 1100) SpO2:  [91 %-100 %] 94 % (08/17 1100) Arterial Line BP: (112-194)/(42-81) 160/81 mmHg (08/17 1000) Weight:  [80.2 kg (176 lb 12.9 oz)] 80.2 kg (176 lb 12.9 oz) (08/16 1350) Intake/Output      08/16 0701 - 08/17 0700 08/17 0701 - 08/18 0700   P.O. 1040    I.V. (mL/kg) 4639.2 (57.8) 325 (4.1)   IV Piggyback 385    Total Intake(mL/kg) 6064.2 (75.6) 325 (4.1)   Urine (mL/kg/hr) 9500 (4.9) 1700 (4.7)   Blood 400 (0.2)    Total Output 9900 1700   Net -3835.8 -1375         Awake, alert, oriented Speech fluent CN grossly intact Good strength throughout  LABS: Lab Results  Component Value Date   CREATININE 0.73 04/26/2015   BUN <5* 04/26/2015   NA 150* 04/26/2015   K 3.3* 04/26/2015   CL 117* 04/26/2015   CO2 21* 04/26/2015   Lab Results  Component Value Date   WBC 10.0 04/17/2015   HGB 11.6* 04/25/2015   HCT 34.0* 04/25/2015   MCV 90.3 04/17/2015   PLT 314 04/17/2015    IMPRESSION: - 64 y.o. 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 Neurologically at baseline with postop DI  PLAN: - Cont to monitor serum Na - Water at bedside, patient encouraged to continue drinking water whenever thirsty - May need a dose of ddAVP if serum Na continues to increase - Change IVF to 1/2NS  - Cont Keppra - Cont hydrocortisone today, will check am cortisol level tomorrow.

## 2015-04-26 NOTE — Progress Notes (Signed)
Patients urine output continues to increase~1793ml over last 2 hours. Urine specific gravity decreased further to 1.0008 despite patient drinking over 500cc water thus far. Patient continues to be agitated/anxious despite 2mg  versed given.  Notified Dr Annette Stable, no new orders received except to continue to encourage water intake. Will continue to monitor.  Danyella Mcginty, [place] Marie  04/26/2015 3:05 AM

## 2015-04-26 NOTE — Progress Notes (Signed)
Patient with increased agitation, anxiety, paranoia and hallucinations. Noted patient dumped 2458ml clear urine over last 4 hours. Specific gravity checked- 1.0014. Notified Dr Annette Stable, orders received for 1-2mg  versed Q2 hours and keep patient well hydrated. Called patients son Remo Lipps to update him on patients persistent anxiety/agitation. Phone given to patient to talk to son about her concerns. Will continue to monitor.  Britani Beattie, [place], RN 04/26/2015 1:00 AM

## 2015-04-27 DIAGNOSIS — G930 Cerebral cysts: Principal | ICD-10-CM

## 2015-04-27 DIAGNOSIS — E232 Diabetes insipidus: Secondary | ICD-10-CM

## 2015-04-27 LAB — T4, FREE: FREE T4: 0.98 ng/dL (ref 0.61–1.12)

## 2015-04-27 LAB — CORTISOL-AM, BLOOD: CORTISOL - AM: 4.6 ug/dL — AB (ref 6.7–22.6)

## 2015-04-27 LAB — SODIUM: Sodium: 144 mmol/L (ref 135–145)

## 2015-04-27 MED ORDER — LEVETIRACETAM 500 MG PO TABS
500.0000 mg | ORAL_TABLET | Freq: Two times a day (BID) | ORAL | Status: DC
  Administered 2015-04-27 – 2015-05-01 (×9): 500 mg via ORAL
  Filled 2015-04-27 (×11): qty 1

## 2015-04-27 MED ORDER — DESMOPRESSIN ACETATE 0.2 MG PO TABS
0.2000 mg | ORAL_TABLET | Freq: Three times a day (TID) | ORAL | Status: DC
  Administered 2015-04-27 – 2015-04-28 (×4): 0.2 mg via ORAL
  Filled 2015-04-27 (×6): qty 1

## 2015-04-27 MED ORDER — HYDROCORTISONE NA SUCCINATE PF 100 MG IJ SOLR
50.0000 mg | Freq: Two times a day (BID) | INTRAMUSCULAR | Status: DC
  Administered 2015-04-27 – 2015-04-28 (×3): 50 mg via INTRAVENOUS
  Filled 2015-04-27 (×5): qty 1

## 2015-04-27 MED ORDER — DEXTROSE 5 % IV SOLN
INTRAVENOUS | Status: DC
  Administered 2015-04-27 – 2015-04-28 (×3): via INTRAVENOUS

## 2015-04-27 NOTE — Evaluation (Signed)
Physical Therapy Evaluation Patient Details Name: Kathy Tucker MRN: [id number] DOB: [birth year] Today's Date: 04/27/2015   History of Present Illness  Kathy Tucker is a 64 y/o female who has been admitted for suprasellar cyst surgery was done on 04/25/15  Clinical Impression  Patient presents with decreased balance and safety with mobility and will benefit from skilled PT in the acute setting to allow return home with family assist and HHPT.  Will further assess need for cane.    Follow Up Recommendations Home health PT;Supervision - Intermittent    Equipment Recommendations  Other (comment) (TBA)    Recommendations for Other Services       Precautions / Restrictions Precautions Precautions: Fall      Mobility  Bed Mobility Overal bed mobility: Modified Independent                Transfers Overall transfer level: Needs assistance   Transfers: Sit to/from Stand Sit to Stand: Min guard;Supervision         General transfer comment: for safety with lines  Ambulation/Gait Ambulation/Gait assistance: Min guard Ambulation Distance (Feet): 150 Feet Assistive device: None Gait Pattern/deviations: Step-through pattern;Wide base of support     General Gait Details: increased lateral sway patient states helps her with balance  Stairs            Wheelchair Mobility    Modified Rankin (Stroke Patients Only)       Balance Overall balance assessment: Needs assistance           Standing balance-Leahy Scale: Good Standing balance comment: stands and leans over to fix bed covers prior to getting in bed                             Pertinent Vitals/Pain Pain Assessment: Faces Faces Pain Scale: Hurts little more Pain Location: headache Pain Descriptors / Indicators: Aching Pain Intervention(s): Monitored during session;Ice applied    Home Living Family/patient expects to be discharged to:: Private residence Living Arrangements:  Children Available Help at Discharge: Family Type of Home: House Home Access: Ramped entrance     [place]: One level [place]: None      Prior Function Level of Independence: Independent               Hand Dominance        Extremity/Trunk Assessment               Lower Extremity Assessment: Overall WFL for tasks assessed         Communication   Communication: No difficulties  Cognition Arousal/Alertness: Awake/alert Behavior During Therapy: Anxious Overall Cognitive Status: Impaired/Different from baseline Area of Impairment: Attention;Problem solving   Current Attention Level: Sustained Memory: Decreased short-term memory       Problem Solving:  (paranoia)      General Comments      Exercises        Assessment/Plan    PT Assessment Patient needs continued PT services  PT Diagnosis Abnormality of gait;Generalized weakness;Altered mental status   PT Problem List Decreased strength;Decreased activity tolerance;Decreased balance;Decreased mobility;Decreased safety awareness;Decreased knowledge of use of DME;Decreased cognition  PT Treatment Interventions DME instruction;Balance training;Gait training;Patient/family education;Functional mobility training;Therapeutic activities;Therapeutic exercise   PT Goals (Current goals can be found in the Care Plan section) Acute Rehab PT Goals Patient Stated Goal: To go home PT Goal Formulation: With patient/family Time For Goal Achievement: 05/11/15 Potential to Achieve Goals: Good  Frequency Min 3X/week   Barriers to discharge        Co-evaluation               End of Session Equipment Utilized During Treatment: Gait belt Activity Tolerance: Patient tolerated treatment well Patient left: in bed;with call bell/phone within reach;with family/visitor present           Time: 1630-1640 PT Time Calculation (min) (ACUTE ONLY): 10 min   Charges:   PT Evaluation $Initial PT  Evaluation Tier I: 1 Procedure     PT G Codes:        Kathy Tucker,Kathy Tucker 2015/05/20, 5:08 PM  Kathy Tucker, [place] 2015-05-20

## 2015-04-27 NOTE — Progress Notes (Signed)
Per P&T approved IV to PO protocol, this patient's Keppra has been changed to PO formulation. This change was made since patient has been taking other oral medications and has been on a regular diet for >24h AND she has not had any seizures this admission.  In addition, IV Keppra is in normal saline, so switching to PO can eliminate another source of sodium.  Please contact pharmacy with any questions.  Thanks, Almeta Monas. Delawrence Fridman, PharmD, BCPS Clinical Pharmacist 04/27/2015 8:15 AM

## 2015-04-27 NOTE — Progress Notes (Signed)
Pt seen and examined. No issues overnight.   EXAM: Temp:  [98.2 F (36.8 C)-99.5 F (37.5 C)] 98.6 F (37 C) (08/18 1132) Pulse Rate:  [84-115] 90 (08/18 1100) Resp:  [11-23] 17 (08/18 1100) BP: (118-173)/(67-100) 138/91 mmHg (08/18 1100) SpO2:  [92 %-99 %] 92 % (08/18 1100) Arterial Line BP: (114-184)/(80-96) 114/93 mmHg (08/17 2030) Intake/Output      08/17 0701 - 08/18 0700 08/18 0701 - 08/19 0700   P.O. 1800    I.V. (mL/kg) 1695 (21.1) 300 (3.7)   IV Piggyback 205    Total Intake(mL/kg) 3700 (46.1) 300 (3.7)   Urine (mL/kg/hr) 9800 (5.1) 550 (1.1)   Blood     Total Output 9800 550   Net -6100 -250         Awake, alert, appears slightly confused CN intact, VFF to confrontation Moving all extremities well Wound c/d/i  LABS: Lab Results  Component Value Date   CREATININE 0.73 04/26/2015   BUN <5* 04/26/2015   NA 144 04/27/2015   K 3.3* 04/26/2015   CL 117* 04/26/2015   CO2 21* 04/26/2015   Lab Results  Component Value Date   WBC 10.0 04/17/2015   HGB 11.6* 04/25/2015   HCT 34.0* 04/25/2015   MCV 90.3 04/17/2015   PLT 314 04/17/2015    IMAGING: MRI reviewed demonstrating decrease in size of suprasellar cyst with decrease in inferior 3rd ventricular mass effect. Cyst wall and inferior enhancing region remain.  IMPRESSION: - 64 y.o. 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

## 2015-04-27 NOTE — Consult Note (Signed)
Reason for Consult: Management of hypopituitarism and diabetes insipidus  Referring Physician: Kauri Garson is an 64 y.o. female.    HPI: This lady has been admitted for suprasellar cyst surgery was done on 04/25/15  The patient is a relatively poor historian but apparently has had headaches since about January Previously was having headaches which reportedly were migraine headaches since she was very young but she thinks this stopped after her gynecological surgery  She has had a couple of falls this year including the one 2 months ago when she was evaluated in the ER with a CT scan She also gives a history of tiredness and fatigue in the last few months  She was having problems with lightheadedness and dizziness and apparently her antihypertensive drugs was stopped because of this a few weeks ago She has not had any weight change, nausea or change in bowel habits.  She thinks she has some abdominal distention present She does tend to get alternatively hot and cold  She also has had a history of blurred vision for several years, this appears to be better after her surgery for the suprasellar cyst  Since her hospital stay and surgery she has had increased urine output, has had nearly 10 L in the last 24 hours and today has an output of roughly 300 mL an hour She is still catheterized and urine output is being measured with her Foley catheter Currently her IV has been half normal saline at 75 mL an hour and she has had a negative fluid balance of about 5000 mL She does complain of increased thirst  Her sodium be high initially but this is improving    Lab Results  Component Value Date   CREATININE 0.73 04/26/2015   BUN <5* 04/26/2015   NA 144 04/27/2015   K 3.3* 04/26/2015   CL 117* 04/26/2015   CO2 21* 04/26/2015   No results found for: TSH, FREET4   Past Medical History  Diagnosis Date  . Hypertension   . Dysrhythmia     heart rate speeds up, usually  with anixety  . Shortness of breath dyspnea     with exertion  . Anxiety   . Depression   . Short-term memory loss     Past 8 months  . GERD (gastroesophageal reflux disease)     has taken Zantac- not recently  . Arthritis   . History of blood transfusion     after csection    Past Surgical History  Procedure Laterality Date  . Cesarean section      x 3  . Cyst excision Right     from hand  . Cardiac electrophysiology study and ablation      in [place] ? 2008 ish  . Craniotomy Right 04/25/2015    Procedure: Right stereotactic frontotemporal craniotomy for Resection of Tumor;  Surgeon: Consuella Lose, MD;  Location: [place] [facility];  Service: Neurosurgery;  Laterality: Right;  Right stereotactic frontotemporal craniotomy for resection of tumor with brain lab  . Application of cranial navigation Right 04/25/2015    Procedure: APPLICATION OF CRANIAL NAVIGATION;  Surgeon: Consuella Lose, MD;  Location: [facility];  Service: Neurosurgery;  Laterality: Right;    History reviewed. No pertinent family history.  Social History:  reports that she has been smoking.  She does not have any smokeless tobacco history on file. She reports that she does not drink alcohol or use illicit drugs.  Allergies:  Allergies  Allergen Reactions  .  Sulfa Antibiotics Rash    Hives  . Bupropion   . Citalopram Hydrobromide   . Fluoxetine Hcl     Medications: I have reviewed the patient's current medications.  Marland Kitchen acetaminophen  1,000 mg Oral Once   Or  . acetaminophen (TYLENOL) oral liquid 160 mg/5 mL  960 mg Oral Once  . amLODipine  5 mg Oral Daily  . desmopressin  0.2 mg Oral 3 times per day  . docusate sodium  100 mg Oral BID  . hydrocortisone sod succinate (SOLU-CORTEF) inj  50 mg Intravenous Q12H  . levETIRAcetam  500 mg Oral Q12H  . lisinopril  20 mg Oral Daily  . pantoprazole  40 mg Oral QHS  . senna  1 tablet Oral BID    Results for orders placed or performed during the hospital  encounter of 04/25/15 (from the past 48 hour(s))  Basic metabolic panel     Status: Abnormal   Collection Time: 04/26/15 12:45 AM  Result Value Ref Range   Sodium 144 135 - 145 mmol/L   Potassium 3.6 3.5 - 5.1 mmol/L   Chloride 114 (H) 101 - 111 mmol/L   CO2 17 (L) 22 - 32 mmol/L   Glucose, Bld 153 (H) 65 - 99 mg/dL   BUN <5 (L) 6 - 20 mg/dL   Creatinine, Ser 0.76 0.44 - 1.00 mg/dL   Calcium 9.0 8.9 - 10.3 mg/dL   GFR calc non Af Amer >60 >60 mL/min   GFR calc Af Amer >60 >60 mL/min    Comment: (NOTE) The eGFR has been calculated using the CKD EPI equation. This calculation has not been validated in all clinical situations. eGFR's persistently <60 mL/min signify possible Chronic Kidney Disease.    Anion gap 13 5 - 15  Basic metabolic panel     Status: Abnormal   Collection Time: 04/26/15  5:45 AM  Result Value Ref Range   Sodium 150 (H) 135 - 145 mmol/L   Potassium 3.3 (L) 3.5 - 5.1 mmol/L   Chloride 117 (H) 101 - 111 mmol/L   CO2 21 (L) 22 - 32 mmol/L   Glucose, Bld 155 (H) 65 - 99 mg/dL   BUN <5 (L) 6 - 20 mg/dL   Creatinine, Ser 0.73 0.44 - 1.00 mg/dL   Calcium 9.2 8.9 - 10.3 mg/dL   GFR calc non Af Amer >60 >60 mL/min   GFR calc Af Amer >60 >60 mL/min    Comment: (NOTE) The eGFR has been calculated using the CKD EPI equation. This calculation has not been validated in all clinical situations. eGFR's persistently <60 mL/min signify possible Chronic Kidney Disease.    Anion gap 12 5 - 15  Sodium     Status: Abnormal   Collection Time: 04/26/15 11:19 AM  Result Value Ref Range   Sodium 148 (H) 135 - 145 mmol/L  Glucose, capillary     Status: Abnormal   Collection Time: 04/26/15  7:29 PM  Result Value Ref Range   Glucose-Capillary 175 (H) 65 - 99 mg/dL  Glucose, capillary     Status: Abnormal   Collection Time: 04/26/15 11:40 PM  Result Value Ref Range   Glucose-Capillary 150 (H) 65 - 99 mg/dL  Cortisol-am, blood     Status: Abnormal   Collection Time: 04/27/15   7:13 AM  Result Value Ref Range   Cortisol - AM 4.6 (L) 6.7 - 22.6 ug/dL  Sodium     Status: None   Collection Time: 04/27/15 10:00 AM  Result Value Ref Range   Sodium 144 135 - 145 mmol/L    Mr Jeri Cos Wo Contrast  04/26/2015   CLINICAL DATA:  Suprasellar tumor status post resection 04/25/2015, 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  EXAM: MRI HEAD WITHOUT AND WITH CONTRAST  TECHNIQUE: Multiplanar, multiecho pulse sequences of the brain and surrounding structures were obtained without and with intravenous contrast.  CONTRAST:  16 mL MultiHance  COMPARISON:  04/21/2015  FINDINGS: Sequelae of interval right pterional craniotomy are identified for resection of the previously described suprasellar mass. There is no acute infarct. Small volume pneumocephalus is present. There are trace bilateral subdural fluid collections without associated mass effect and mild diffuse dural enhancement, likely postoperative. Lateral ventricles have decreased in size. Gas, swelling, and skin staples are noted in the soft tissues at the craniotomy site.  Residual cystic and solid suprasellar/hypothalamic region mass measures 1.6 x 2.2 x 1.6 cm (transverse by AP by craniocaudal, previously 3.0 x 2.7 x 2.7 cm). The superior portion of the lesion remains cystic with peripheral enhancement, with an approximately 10 mm focus of nodular solid enhancement at the anteroinferior aspect of the mass. Local mass effect has greatly decreased, with mild residual mass effect on the floor the third ventricle.  No new lesions are seen elsewhere. Patchy T2 hyperintensities in the deep greater than subcortical cerebral white matter bilaterally are again noted and nonspecific.  Orbits are unremarkable. No significant inflammatory disease is seen in the paranasal sinuses or mastoid air cells. Major intracranial vascular flow voids are preserved.  IMPRESSION: 1. Postoperative changes from interval debulking of cystic and solid suprasellar mass as above. 2. Trace  bilateral subdural fluid collections without mass effect. 3. No acute infarct   Electronically Signed   By: Logan Bores   On: 04/26/2015 18:08    Review of Systems  Constitutional: Positive for malaise/fatigue. Negative for weight loss and diaphoresis.  HENT: Negative for congestion and hearing loss.   Eyes: Positive for blurred vision. Negative for double vision.  Respiratory: Negative for shortness of breath.   Cardiovascular: Negative for palpitations and leg swelling.  Gastrointestinal: Negative for abdominal pain and constipation.  Musculoskeletal: Negative for joint pain.  Skin: Negative for rash.  Neurological: Positive for dizziness, weakness and headaches. Negative for seizures.  Endo/Heme/Allergies: Positive for polydipsia.  Psychiatric/Behavioral: The patient is not nervous/anxious.    Patient reports history of hypertension for about a year prior to her medication then stop this year when her blood pressure started getting lower.  However blood pressure appears to be relatively high in the office now  Blood pressure 138/91, pulse 90, temperature 98.6 F (37 C), temperature source Oral, resp. rate 17, height $RemoveBe'5\' 6"'iBnyCCrqx$  (1.676 m), weight 176 lb 12.9 oz (80.2 kg), SpO2 92 %. Physical Exam  Constitutional: She is oriented to person, place, and time. She appears well-developed and well-nourished. No distress.  HENT:  Oral mucosa and tongue are dry, tongue slightly coated  Eyes: Pupils are equal, round, and reactive to light.  Neck: No tracheal deviation present. No thyromegaly present.  Cardiovascular: Normal heart sounds.   No murmur heard. Respiratory: No respiratory distress. She has no wheezes. She has no rales.  GI: She exhibits distension. She exhibits no mass. There is no tenderness.  Gaseous distention with some tympany present  Musculoskeletal: She exhibits no edema or tenderness.  Lymphadenopathy:    She has no cervical adenopathy.  Neurological: She is oriented to  person, place, and time. No cranial nerve deficit.  Biceps reflexes  are brisk, ankle reflexes difficult to elicit  Psychiatric: She has a normal mood and affect.    Assessment/Plan: DIABETES insipidus:  She has had profound increase in urine output since surgery and currently has had urine output of about 300 mL an hour, has had well over 3 L of urine output since this morning He mucous membranes are dry She is in negative fluid balance also and currently not getting any vasopressin  Would like to start her on DDAVP, most likely this should be short-term and will have her take this every 8 hours. She can still be monitored for her urine output every shift If her urine output drops below 800 mL per shift this can be stopped  HYPOPITUITARISM: This needs to be assessed. Her cortisol level is low normal but she has been getting steroids since her surgery and this level is hard to assess. Most likely she can go home on hydrocortisone 20 mg twice a day and her cortisol function can be evaluated further  Will need to check her free T4 level to rule out secondary hypothyroidism although clinically does not appear to be hypothyroid  Thank you for the consultation   [facility] 04/27/2015, 2:48 PM

## 2015-04-28 DIAGNOSIS — E871 Hypo-osmolality and hyponatremia: Secondary | ICD-10-CM

## 2015-04-28 LAB — BASIC METABOLIC PANEL
Anion gap: 11 (ref 5–15)
CHLORIDE: 95 mmol/L — AB (ref 101–111)
CO2: 26 mmol/L (ref 22–32)
Calcium: 9.1 mg/dL (ref 8.9–10.3)
Creatinine, Ser: 0.68 mg/dL (ref 0.44–1.00)
Glucose, Bld: 130 mg/dL — ABNORMAL HIGH (ref 65–99)
POTASSIUM: 3.6 mmol/L (ref 3.5–5.1)
SODIUM: 132 mmol/L — AB (ref 135–145)

## 2015-04-28 LAB — SODIUM: SODIUM: 129 mmol/L — AB (ref 135–145)

## 2015-04-28 MED ORDER — DEXTROSE-NACL 5-0.9 % IV SOLN
INTRAVENOUS | Status: DC
  Administered 2015-04-28 – 2015-04-29 (×2): via INTRAVENOUS

## 2015-04-28 MED ORDER — HYDROMORPHONE HCL 1 MG/ML IJ SOLN
1.0000 mg | Freq: Once | INTRAMUSCULAR | Status: AC
  Administered 2015-04-28: 1 mg via INTRAVENOUS
  Filled 2015-04-28: qty 1

## 2015-04-28 MED ORDER — HYDROCORTISONE 20 MG PO TABS
20.0000 mg | ORAL_TABLET | Freq: Two times a day (BID) | ORAL | Status: DC
  Administered 2015-04-28 – 2015-05-01 (×6): 20 mg via ORAL
  Filled 2015-04-28 (×7): qty 1

## 2015-04-28 NOTE — Progress Notes (Signed)
Pt seen and examined. No issues overnight. Pt has c/o HA. Able to ambulate around unit yesterday with PT.  EXAM: Temp:  [97.6 F (36.4 C)-98.6 F (37 C)] 98.4 F (36.9 C) (08/19 0805) Pulse Rate:  [90-150] 150 (08/18 1704) Resp:  [14-30] 19 (08/19 0800) BP: (131-183)/(67-111) 183/88 mmHg (08/19 0800) SpO2:  [92 %-100 %] 96 % (08/18 1500) Intake/Output      08/18 0701 - 08/19 0700 08/19 0701 - 08/20 0700   P.O. 1237    I.V. (mL/kg) 2880 (35.9) 150 (1.9)   IV Piggyback     Total Intake(mL/kg) 4117 (51.3) 150 (1.9)   Urine (mL/kg/hr) 3950 (2.1)    Total Output 3950     Net +167 +150         Awake, alert CN grossly intact, VFF Moving all extremities well, good strength  LABS: Lab Results  Component Value Date   CREATININE 0.68 04/28/2015   BUN <5* 04/28/2015   NA 132* 04/28/2015   K 3.6 04/28/2015   CL 95* 04/28/2015   CO2 26 04/28/2015   Lab Results  Component Value Date   WBC 10.0 04/17/2015   HGB 11.6* 04/25/2015   HCT 34.0* 04/25/2015   MCV 90.3 04/17/2015   PLT 314 04/17/2015    IMPRESSION: - 64 y.o. female POD# 3 s/p subtotal resection craniopharyngioma, postop pan-hypopituitarism. - started 0.2mg  ddAVP Q8 PRN per endocrine. Is now hyponatremic  PLAN: - Will recheck Na at noon, urine output has dropped, will likely need to hold ddAVP dose - Cont hydrocortisone - Cont Keppra

## 2015-04-28 NOTE — Progress Notes (Signed)
Occupational Therapy Evaluation Patient Details Name: Kathy Tucker MRN: [id number] DOB: 19-Jun-1951 Today's Date: 04/28/2015    History of Present Illness Kathy Tucker is a 64 y/o female who has been admitted for suprasellar cyst surgery was done on 04/25/15   Clinical Impression   PTA, pt lived with son and had assistance with higher level ADL tasks due to difficulty with memory. Pt reports history of falls. Pt currently requires min A with LB ADL and demonstrates difficulty with higher level cognitive executive level functions, in addition to decreased STM. Feel pt will benefit from [facility] to maximize functional level of independence and safety within the home. Will continue to follow acutely to address established goals.     Follow Up Recommendations  Home health OT;Supervision/Assistance - 24 hour    Equipment Recommendations  None recommended by OT    Recommendations for Other Services       Precautions / Restrictions Precautions Precautions: Fall      Mobility Bed Mobility Overal bed mobility: Modified Independent                Transfers Overall transfer level: Needs assistance   Transfers: Sit to/from Stand;Stand Pivot Transfers Sit to Stand: Supervision Stand pivot transfers: Min guard       General transfer comment: mild unsteadiness when distracted    Balance Overall balance assessment: Needs assistance Sitting-balance support: No upper extremity supported Sitting balance-Leahy Scale: Normal     Standing balance support: No upper extremity supported Standing balance-Leahy Scale: Fair               High level balance activites: Backward walking;Direction changes;Turns;Sudden stops;Head turns (stepping over obstacles) High Level Balance Comments: Mild unsteadiness during ADL task            ADL Overall ADL's : Needs assistance/impaired     Grooming: Set up   Upper Body Bathing: Set up   Lower Body Bathing: Min guard;Sit to/from  stand   Upper Body Dressing : Set up   Lower Body Dressing: Min guard;Sit to/from stand   Toilet Transfer: Min guard;Ambulation;Regular Toilet   Toileting- Water quality scientist and Hygiene: Min guard       Functional mobility during ADLs: Minimal assistance General ADL Comments: general "unsteadiness";      Vision Additional Comments: will further assess Wears glasses  Perception     Praxis      Pertinent Vitals/Pain Pain Assessment: 0-10 Faces Pain Scale: Hurts worst Pain Location: headache Pain Descriptors / Indicators: Aching Pain Intervention(s): Limited activity within patient's tolerance;Monitored during session;Ice applied     Hand Dominance     Extremity/Trunk Assessment Upper Extremity Assessment Upper Extremity Assessment: Overall WFL for tasks assessed   Lower Extremity Assessment Lower Extremity Assessment: Overall WFL for tasks assessed   Cervical / Trunk Assessment Cervical / Trunk Assessment: Normal   Communication Communication Communication: No difficulties   Cognition Arousal/Alertness: Awake/alert Behavior During Therapy: WFL for tasks assessed/performed Overall Cognitive Status: Impaired/Different from baseline Area of Impairment: Attention;Safety/judgement;Problem solving;Memory   Current Attention Level: Selective Memory: Decreased short-term memory   Safety/Judgement: Decreased awareness of deficits     General Comments: will further assess cognition. Pt states she feels she is "clearing up". Son states she was having difficulty with memory PTA .    General Comments       Exercises       Shoulder Instructions      Home Living Family/patient expects to be discharged to:: Private residence Living Arrangements: Children  Available Help at Discharge: Family Type of Home: House Home Access: Ramped entrance     Home Layout: One level     Bathroom Shower/Tub: Tub/shower unit Shower/tub characteristics: Primary school teacher: Standard Bathroom Accessibility: Yes How Accessible: Accessible via wheelchair;Accessible via walker Home Equipment: Tub bench;Other (comment) (family has BSC if needed; son is in w/c so house is accessib)          Prior Functioning/Environment Level of Independence: Needs assistance  Gait / Transfers Assistance Needed: falls PTA ADL's / Homemaking Assistance Needed: family assisted with finances and meals; states pt spent majority of time in bed        OT Diagnosis: Generalized weakness;Acute pain;Cognitive deficits   OT Problem List: Decreased activity tolerance;Decreased strength;Decreased cognition;Decreased safety awareness;Decreased knowledge of use of DME or AE;Pain   OT Treatment/Interventions: Self-care/ADL training;Therapeutic exercise;DME and/or AE instruction;Therapeutic activities;Cognitive remediation/compensation;Patient/family education    OT Goals(Current goals can be found in the care plan section) Acute Rehab OT Goals Patient Stated Goal: to go home OT Goal Formulation: With patient Time For Goal Achievement: 05/12/15 Potential to Achieve Goals: Good  OT Frequency: Min 2X/week   Barriers to D/C:            Co-evaluation              End of Session Nurse Communication: Mobility status  Activity Tolerance: Patient limited by pain Patient left: in bed;with call bell/phone within reach;with bed alarm set   Time: 6979-4801 OT Time Calculation (min): 27 min Charges:  OT General Charges $OT Visit: 1 Procedure OT Evaluation $Initial OT Evaluation Tier I: 1 Procedure OT Treatments $Self Care/Home Management : 8-22 mins G-Codes:    Ruby Logiudice,HILLARY 05-18-15, 12:43 PM   [facility], OTR/L  [phone] 05/18/15

## 2015-04-28 NOTE — Progress Notes (Addendum)
Physical Therapy Treatment Patient Details Name: NALANI ANDREEN MRN: [id number] DOB: Nov 07, 1950 Today's Date: 04/28/2015    History of Present Illness Ms. Wiens is a 64 y/o female who has been admitted for suprasellar cyst surgery was done on 04/25/15    PT Comments    Progressing well.  More stable with gait and handled balance challenges well.  Follow Up Recommendations  Home health PT;Supervision - Intermittent     Equipment Recommendations  Other (comment)    Recommendations for Other Services       Precautions / Restrictions Precautions Precautions: Fall    Mobility  Bed Mobility Overal bed mobility: Modified Independent                Transfers Overall transfer level: Modified independent   Transfers: Sit to/from Stand Sit to Stand: Modified independent (Device/Increase time)            Ambulation/Gait Ambulation/Gait assistance: Supervision Ambulation Distance (Feet): 300 Feet Assistive device: None Gait Pattern/deviations: Step-through pattern Gait velocity: able to increase speed appreciably Gait velocity interpretation: at or above normal speed for age/gender General Gait Details: mildly increase sway and drift, but no LOB   Stairs Stairs: Yes Stairs assistance: Supervision Stair Management: One rail Left;Alternating pattern;Forwards Number of Stairs: 5 General stair comments: steady with rail  Wheelchair Mobility    Modified Rankin (Stroke Patients Only)       Balance Overall balance assessment: Needs assistance Sitting-balance support: No upper extremity supported Sitting balance-Leahy Scale: Normal     Standing balance support: No upper extremity supported Standing balance-Leahy Scale: Good               High level balance activites: Backward walking;Direction changes;Turns;Sudden stops;Head turns (stepping over obstacles) High Level Balance Comments: no overt LOB or deviation.    Cognition Arousal/Alertness:  Awake/alert Behavior During Therapy: WFL for tasks assessed/performed Overall Cognitive Status: Within Functional Limits for tasks assessed                      Exercises      General Comments        Pertinent Vitals/Pain Pain Assessment: Faces Faces Pain Scale: Hurts even more Pain Location: headache Pain Descriptors / Indicators: Aching Pain Intervention(s): Monitored during session;Patient requesting pain meds-RN notified    Home Living                      Prior Function            PT Goals (current goals can now be found in the care plan section) Acute Rehab PT Goals Patient Stated Goal: To go home PT Goal Formulation: With patient/family Time For Goal Achievement: 05/11/15 Potential to Achieve Goals: Good Progress towards PT goals: Progressing toward goals    Frequency  Min 3X/week    PT Plan Current plan remains appropriate    Co-evaluation             End of Session   Activity Tolerance: Patient tolerated treatment well Patient left: in bed;with call bell/phone within reach;with family/visitor present     Time: 1000-1025 PT Time Calculation (min) (ACUTE ONLY): 25 min  Charges:  $Gait Training: 8-22 mins $Therapeutic Activity: 8-22 mins                    G Codes:      Cricket Goodlin, Tessie Fass 04/28/2015, 10:54 AM  04/28/2015  Donnella Sham, PT [phone]  (pager)

## 2015-04-28 NOTE — Progress Notes (Signed)
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  Her blood pressure has been improved, previously had been high  Objective: Vital signs in last 24 hours: Temp:  [97.6 F (36.4 C)-98.4 F (36.9 C)] 97.6 F (36.4 C) (08/19 1153) Pulse Rate:  [68-80] 68 (08/19 1500) Resp:  [11-30] 17 (08/19 1800) BP: (122-183)/(67-111) 135/78 mmHg (08/19 1800) SpO2:  [93 %-98 %] 93 % (08/19 1500) Weight change:  Last BM Date: 04/25/15 (per pt, refuses bowel med tx at this time)  Intake/Output from previous day: 08/18 0701 - 08/19 0700 In: 4117 [P.O.:1237; I.V.:2880] Out: 3950 [Urine:3950] Intake/Output this shift: Total I/O In: 1086.3 [I.V.:1086.3] Out: 700 [Urine:700]  EXAM:  Mucous membranes do not appear excessively dry She is very alert  Lab Results: No results for input(s): WBC, HGB, HCT, PLT in the last 72 hours. BMET  Recent Labs  04/26/15 0545  04/28/15 0602 04/28/15 1139  NA 150*  < > 132* 129*  K 3.3*  --  3.6  --   CL 117*  --  95*  --   CO2 21*  --  26  --   GLUCOSE 155*  --  130*  --   BUN <5*  --  <5*  --   CREATININE 0.73  --  0.68  --   CALCIUM 9.2  --  9.1  --   < > = values in this interval not displayed.  Studies/Results: No results found.  Medications:  Current facility-administered medications:  .  acetaminophen (TYLENOL) tablet 1,000 mg, 1,000 mg, Oral, Once **OR** acetaminophen (TYLENOL) solution 960 mg, 960 mg, Oral, Once, Marye Round, MD .  bisacodyl (DULCOLAX)  suppository 10 mg, 10 mg, Rectal, Daily PRN, Consuella Lose, MD .  dextrose 5 %-0.9 % sodium chloride infusion, , Intravenous, Continuous, Elayne Snare, MD, Last Rate: 75 mL/hr at 04/28/15 1800 .  docusate sodium (COLACE) capsule 100 mg, 100 mg, Oral, BID, Consuella Lose, MD, 100 mg at 04/28/15 0912 .  gadobenate dimeglumine (MULTIHANCE) injection 16 mL, 16 mL, Intravenous, Once PRN, Medication Radiologist, MD .  HYDROcodone-acetaminophen (NORCO/VICODIN) 5-325 MG per tablet 1 tablet, 1 tablet, Oral, Q4H PRN, Consuella Lose, MD, 1 tablet at 04/28/15 1412 .  hydrocortisone (CORTEF) tablet 20 mg, 20 mg, Oral, BID, Elayne Snare, MD .  labetalol (NORMODYNE,TRANDATE) injection 10-40 mg, 10-40 mg, Intravenous, Q10 min PRN, Consuella Lose, MD, 20 mg at 04/25/15 1543 .  levETIRAcetam (KEPPRA) tablet 500 mg, 500 mg, Oral, Q12H, Lauren D Bajbus, RPH, 500 mg at 04/28/15 1732 .  lisinopril (PRINIVIL,ZESTRIL) tablet 20 mg, 20 mg, Oral, Daily, Consuella Lose, MD, 20 mg at 04/28/15 0912 .  midazolam (VERSED) injection 1-2 mg, 1-2 mg, Intravenous, Q2H PRN, Earnie Larsson, MD, 2 mg at 04/28/15 0604 .  morphine 2 MG/ML injection 1-2 mg, 1-2 mg, Intravenous, Q4H PRN, Consuella Lose, MD, 2 mg at 04/28/15 0433 .  nicardipine (CARDENE) 40mg  in 0.83% saline 254ml IV DOUBLE STRENGTH infusion (0.2 mg/ml), 3-15 mg/hr, Intravenous, Continuous, Consuella Lose, MD,  Stopped at 04/26/15 0830 .  ondansetron (ZOFRAN) tablet 4 mg, 4 mg, Oral, Q4H PRN **OR** ondansetron (ZOFRAN) injection 4 mg, 4 mg, Intravenous, Q4H PRN, Consuella Lose, MD, 4 mg at 04/28/15 0912 .  oxyCODONE (Oxy IR/ROXICODONE) immediate release tablet 10 mg, 10 mg, Oral, Q4H PRN, Consuella Lose, MD, 10 mg at 04/28/15 1832 .  pantoprazole (PROTONIX) EC tablet 40 mg, 40 mg, Oral, QHS, Lauren D Bajbus, RPH, 40 mg at 04/27/15 2135 .  promethazine (PHENERGAN) tablet 12.5-25 mg, 12.5-25 mg, Oral, Q4H PRN, Consuella Lose, MD .  senna (SENOKOT) tablet  8.6 mg, 1 tablet, Oral, BID, Consuella Lose, MD, 8.6 mg at 04/28/15 0912   Assessment/Plan:  DIABETES insipidus: This appears to be resolving and urine output is significantly better She is however getting hyponatremic with getting D5 water  Since her intake output is showing relatively positive balance will switch her for 1 L to using D5 normal saline Will also hold off on her DDAVP  ENDOCRINE: She does not have any decreased level of free T4 ruling out secondary hypothyroidism Since her cortisol level was done after starting Solu-Cortef it is not accurate and not representative of her endogenous secretion  Recommend the following for the weekend   Replace urine losses with equal amounts of fluid intake  Adjust IV fluids based on sodium levels, if she is drinking adequately she can go off IV fluids.  Continue to monitor sodium daily  If he continues to have excessive urinary output over 3 L a day she may be able to benefit from low doses of DDAVP is, 0.1 mg twice a day  Switch IV to oral hydrocortisone, she can be discharged on 20 mg twice a day  Follow-up in the office 1 week after discharge  Hold off on amlodipine as blood pressure is low normal   LOS: 3 days   Kathy Tucker 04/28/2015, 6:46 PM

## 2015-04-28 NOTE — Care Management Important Message (Signed)
Important Message  Patient Details  Name: Kathy Tucker MRN: [id number] Date of Birth: [birth year]   Medicare Important Message Given:  Yes-second notification given    Ella Bodo, RN 04/28/2015, 4:29 PM

## 2015-04-29 LAB — BASIC METABOLIC PANEL
ANION GAP: 11 (ref 5–15)
CALCIUM: 8.8 mg/dL — AB (ref 8.9–10.3)
CO2: 26 mmol/L (ref 22–32)
Chloride: 88 mmol/L — ABNORMAL LOW (ref 101–111)
Creatinine, Ser: 0.55 mg/dL (ref 0.44–1.00)
GFR calc Af Amer: >60 mL/min (ref >60)
GLUCOSE: 128 mg/dL — AB (ref 65–99)
Potassium: 2.7 mmol/L — CL (ref 3.5–5.1)
SODIUM: 125 mmol/L — AB (ref 135–145)

## 2015-04-29 LAB — SODIUM: SODIUM: 134 mmol/L — AB (ref 135–145)

## 2015-04-29 MED ORDER — POTASSIUM CHLORIDE CRYS ER 20 MEQ PO TBCR
40.0000 meq | EXTENDED_RELEASE_TABLET | Freq: Two times a day (BID) | ORAL | Status: AC
  Administered 2015-04-29 (×2): 40 meq via ORAL
  Filled 2015-04-29 (×2): qty 2

## 2015-04-29 MED ORDER — POTASSIUM CHLORIDE 10 MEQ/100ML IV SOLN
10.0000 meq | INTRAVENOUS | Status: AC
  Administered 2015-04-29 (×2): 10 meq via INTRAVENOUS
  Filled 2015-04-29 (×2): qty 100

## 2015-04-29 MED ORDER — CALCIUM CARBONATE ANTACID 500 MG PO CHEW
1.0000 | CHEWABLE_TABLET | Freq: Three times a day (TID) | ORAL | Status: DC | PRN
  Administered 2015-04-29: 200 mg via ORAL
  Filled 2015-04-29: qty 1

## 2015-04-29 NOTE — Progress Notes (Signed)
Patient complaining of pain at IV site following first run of potassium. Attempted to dilute potassium and run at a slower rate. Patient still complaining of discomfort at right forearm IV site. Site is reddened and slightly swollen. Potassium infusion stopped and IV flushed with normal saline. Consult to IV team placed for a new IV. Applied heat to reddened site. Will continue to monitor and update as needed.

## 2015-04-29 NOTE — Progress Notes (Signed)
Pt seen and examined. No issues overnight. Denies increased thirst.  States she has been drinking even when not thirsty because she thought she was supposed to.  Reports vision is better.  EXAM: Temp:  [97.4 F (36.3 C)-98.5 F (36.9 C)] 97.5 F (36.4 C) (08/20 0344) Pulse Rate:  [68-80] 68 (08/19 1500) Resp:  [9-22] 21 (08/20 0400) BP: (122-183)/(70-96) 149/89 mmHg (08/20 0400) SpO2:  [93 %-98 %] 96 % (08/19 2000) Intake/Output      08/19 0701 - 08/20 0700   I.V. (mL/kg) 1836.3 (22.9)   Total Intake(mL/kg) 1836.3 (22.9)   Urine (mL/kg/hr) 1400 (0.7)   Total Output 1400   Net +436.3        Awake, alert, oriented EOMI, PERRL, VFF to confrontation MAE well  LABS: Lab Results  Component Value Date   CREATININE 0.68 04/28/2015   BUN <5* 04/28/2015   NA 129* 04/28/2015   K 3.6 04/28/2015   CL 95* 04/28/2015   CO2 26 04/28/2015   Lab Results  Component Value Date   WBC 10.0 04/17/2015   HGB 11.6* 04/25/2015   HCT 34.0* 04/25/2015   MCV 90.3 04/17/2015   PLT 314 04/17/2015    IMAGING: none  IMPRESSION: - 64 y.o. 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  Had DI, but now hyponatremic.  States she feels well.  Is neurologically well.  PLAN: - AM sodium pending.  Given hyponatremia from yesterday she should stay in the ICU at least one more day until her sodium stabilizes in the normal range. - Continuing to follow endocrine recommendations.

## 2015-04-29 NOTE — Progress Notes (Signed)
CRITICAL VALUE ALERT  Critical value received:  K+ 2.7  Date of notification:  04/29/2015  Time of notification:  0817  Critical value read back:Yes.    Nurse who received alert:  Tharon Aquas, RN  MD notified (1st page):  Dr. Cyndy Freeze  Time of first page:  0820  MD notified (2nd page):   Time of second page:  Responding MD:  Dr. Cyndy Freeze  Time MD responded:  [phone]

## 2015-04-30 LAB — BASIC METABOLIC PANEL
ANION GAP: 10 (ref 5–15)
BUN: 5 mg/dL — ABNORMAL LOW (ref 6–20)
CO2: 26 mmol/L (ref 22–32)
Calcium: 9.6 mg/dL (ref 8.9–10.3)
Chloride: 106 mmol/L (ref 101–111)
Creatinine, Ser: 0.73 mg/dL (ref 0.44–1.00)
GFR calc Af Amer: >60 mL/min (ref >60)
GLUCOSE: 130 mg/dL — AB (ref 65–99)
POTASSIUM: 3.9 mmol/L (ref 3.5–5.1)
Sodium: 142 mmol/L (ref 135–145)

## 2015-04-30 MED ORDER — DIAZEPAM 5 MG/ML IJ SOLN
INTRAMUSCULAR | Status: AC
  Filled 2015-04-30: qty 2

## 2015-04-30 MED ORDER — DESMOPRESSIN ACETATE 0.1 MG PO TABS
0.1000 mg | ORAL_TABLET | Freq: Two times a day (BID) | ORAL | Status: DC
  Administered 2015-04-30 – 2015-05-01 (×3): 0.1 mg via ORAL
  Filled 2015-04-30 (×5): qty 1

## 2015-04-30 MED ORDER — DIAZEPAM 5 MG/ML IJ SOLN
2.5000 mg | Freq: Once | INTRAMUSCULAR | Status: AC
Start: 2015-04-30 — End: 2015-04-30
  Administered 2015-04-30: 2.5 mg via INTRAVENOUS

## 2015-04-30 NOTE — Progress Notes (Addendum)
Pt seen and examined. No issues overnight. Reports more thirst than yesterday.  Urine output much higher today than yesterday.  EXAM: Temp:  [97.1 F (36.2 C)-98 F (36.7 C)] 97.5 F (36.4 C) (08/21 1152) Pulse Rate:  [138] 138 (08/20 1645) Resp:  [11-29] 19 (08/21 1100) BP: (117-161)/(68-91) 126/84 mmHg (08/21 1000) SpO2:  [97 %-98 %] 98 % (08/21 0800) Intake/Output      08/20 0701 - 08/21 0700 08/21 0701 - 08/22 0700   P.O. 1571 240   I.V. (mL/kg) 150 (1.9)    IV Piggyback 150    Total Intake(mL/kg) 1871 (23.3) 240 (3)   Urine (mL/kg/hr) 7110 (3.7)    Total Output 7110     Net -5239 +240         Awake, alert, oriented EOMI, VFF, patient states better acuity than yesterday MAE well  LABS: Lab Results  Component Value Date   CREATININE 0.73 04/30/2015   BUN 5* 04/30/2015   NA 142 04/30/2015   K 3.9 04/30/2015   CL 106 04/30/2015   CO2 26 04/30/2015   Lab Results  Component Value Date   WBC 10.0 04/17/2015   HGB 11.6* 04/25/2015   HCT 34.0* 04/25/2015   MCV 90.3 04/17/2015   PLT 314 04/17/2015    IMPRESSION: - 64 y.o. female s/p craniotomy for biopsy of hypothalamic mass.  In DI.  Urine output up from yesterday.  Hyponatremia resolved.  PLAN: - TTF - Liberalize fluid intake - D/c foley, continue strict I&Os - Given high UOP will resume DDAVP per endocrine recommendations

## 2015-05-01 LAB — TYPE AND SCREEN
ABO/RH(D): O POS
ANTIBODY SCREEN: NEGATIVE
UNIT DIVISION: 0
Unit division: 0

## 2015-05-01 LAB — SODIUM: Sodium: 139 mmol/L (ref 135–145)

## 2015-05-01 MED ORDER — HYDROCORTISONE 20 MG PO TABS: 20.0000 mg | ORAL_TABLET | Freq: Two times a day (BID) | ORAL | Status: AC

## 2015-05-01 MED ORDER — OXYCODONE HCL 10 MG PO TABS: 10.0000 mg | ORAL_TABLET | ORAL | Status: AC | PRN

## 2015-05-01 MED ORDER — LEVETIRACETAM 500 MG PO TABS: 500.0000 mg | ORAL_TABLET | Freq: Two times a day (BID) | ORAL | Status: AC

## 2015-05-01 MED ORDER — HALOPERIDOL LACTATE 5 MG/ML IJ SOLN
1.0000 mg | Freq: Once | INTRAMUSCULAR | Status: AC
  Administered 2015-05-01: 1 mg via INTRAMUSCULAR

## 2015-05-01 MED ORDER — DESMOPRESSIN ACETATE 0.1 MG PO TABS: 0.1000 mg | ORAL_TABLET | Freq: Two times a day (BID) | ORAL | Status: DC

## 2015-05-01 MED ORDER — DIAZEPAM 5 MG PO TABS: 5.0000 mg | ORAL_TABLET | Freq: Two times a day (BID) | ORAL | Status: AC | PRN

## 2015-05-01 MED ORDER — HALOPERIDOL LACTATE 5 MG/ML IJ SOLN
1.0000 mg | Freq: Once | INTRAMUSCULAR | Status: DC
  Filled 2015-05-01: qty 1

## 2015-05-01 NOTE — Progress Notes (Signed)
Occupational Therapy Treatment Patient Details Name: Kathy Tucker MRN: [id number] DOB: 1951-08-04 Today's Date: 05/01/2015    History of present illness Kathy Tucker is a 64 y/o female who has been admitted for suprasellar cyst surgery was done on 04/25/15   OT comments  Pt progressing nice towards OT goals, continue plan of care for now. Pt overall min guard>supervision for ADLs and functional mobility without use of an AD. Pt continues to demonstrate a decrease in higher level executive functioning and decreased STM. Continue to recommend HHOT(to maximize independence) and 24/7 supervision secondary to patient's h/o falls.    Follow Up Recommendations  Home health OT;Supervision/Assistance - 24 hour    Equipment Recommendations  None recommended by OT    Recommendations for Other Services  None at this time   Precautions / Restrictions Precautions Precautions: Fall Restrictions Weight Bearing Restrictions: No    Mobility Bed Mobility Overal bed mobility: Modified Independent Transfers Overall transfer level: Needs assistance Equipment used: None Transfers: Sit to/from Stand Sit to Stand: Supervision General transfer comment: mild unsteadiness when distracted    Balance Overall balance assessment: Needs assistance Sitting-balance support: No upper extremity supported;Feet supported Sitting balance-Leahy Scale: Normal     Standing balance support: During functional activity;No upper extremity supported Standing balance-Leahy Scale: Good   ADL Overall ADL's : Needs assistance/impaired General ADL Comments: Pt overall supervision for ADLs and functional mobility/transfers, requiring occassional min guard assist secondary to occassional unsteadiness. Pt able to cross BLEs for LB ADLs. Pt lives with her son who is a paraplegic. Recommending pt have supervision during first shower for safety. Pt states she has a shower seat she can use and she plans to put in mats to decrease  fall in shower.       Cognition   Behavior During Therapy: WFL for tasks assessed/performed Overall Cognitive Status: Impaired/Different from baseline Area of Impairment: Problem solving;Memory   Current Attention Level: Selective Memory: Decreased short-term memory General Comments: Pt realizes she is unsafe for mobility without assistance "I'm too nervous to walk around by myself"; pt more aware of deficits.                  Pertinent Vitals/ Pain       Pain Assessment: 0-10 Pain Score: 10-Worst pain ever Pain Location: back (while in supine position, this pain decreased during ambulation and when sitting in recliner) Pain Descriptors / Indicators: Aching Pain Intervention(s): Monitored during session;Repositioned   Frequency Min 2X/week     Progress Toward Goals  OT Goals(current goals can now befound in the care plan section)  Progress towards OT goals: Progressing toward goals     Plan Discharge plan remains appropriate          Activity Tolerance Patient tolerated treatment well   Patient Left in chair;with call bell/phone within reach;with family/visitor present   Nurse Communication Mobility status      Time: 1200-1220 OT Time Calculation (min): 20 min  Charges: OT General Charges $OT Visit: 1 Procedure OT Treatments $Therapeutic Activity: 8-22 mins  Antwione Picotte , MS, OTR/L, CLT Pager: [phone]  05/01/2015, 1:30 PM

## 2015-05-01 NOTE — Progress Notes (Signed)
Patient is discharged form room 5C05 at this time. Alert and in stable condition. IV site d/c'd. Instructions read to son and understanding verbalized. Left unit via wheelchair with all belongings and son at side.

## 2015-05-01 NOTE — Progress Notes (Signed)
Patients daughter Denee Boeder made aware of patients delusions and states she  does that when alone without family. Patients son in  and patient calm and cooperative with staff. Will continue to monitor.

## 2015-05-01 NOTE — Discharge Summary (Signed)
  Physician Discharge Summary  Patient ID: [name] MRN: [id number] DOB/AGE: 12/31/1950 64 y.o.  Admit date: 04/25/2015 Discharge date: 05/01/2015  Admission Diagnoses: Suprasellar / Hypothalamic tumor  Discharge Diagnoses: Same Active Problems:   Headache   Suprasellar cyst   Discharged Condition: Stable  Hospital Course:  Mrs. Kathy Tucker is a 64 y.o. female electively admitted after craniotomy for subtotal resection of hypothalamic tumor. 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 She was at neurologic baseline postop, but was pan-hypopit with DI and started on ddAVP at the recommendation of endocrine. She was also started on exogenous steroids. Her serum sodium was stable on a 0.1mg  dose of ddAVP. She was ambulating well, tolerating diet, and voiding normally.  Treatments: Surgery - right craniotomy for subtotal resection of craniopharyngioma.  Discharge Exam: Blood pressure 131/73, pulse 101, temperature 98 F (36.7 C), temperature source Oral, resp. rate 20, height 5\' 6"  (1.676 m), weight 80.2 kg (176 lb 12.9 oz), SpO2 92 %. Awake, alert, oriented Speech fluent, appropriate CN grossly intact 5/5 BUE/BLE Wound c/d/i  Disposition: Home     Medication List    STOP taking these medications        GOODY HEADACHE PO      TAKE these medications        amLODipine 10 MG tablet  Commonly known as:  NORVASC  Take 5 mg by mouth daily.     desmopressin 0.1 MG tablet  Commonly known as:  DDAVP  Take 1 tablet (0.1 mg total) by mouth 2 (two) times daily.     diazepam 5 MG tablet  Commonly known as:  VALIUM  Take 1 tablet (5 mg total) by mouth every 12 (twelve) hours as needed for anxiety.     hydrocortisone 20 MG tablet  Commonly known as:  CORTEF  Take 1 tablet (20 mg total) by mouth 2 (two) times daily.     ibuprofen 400 MG tablet  Commonly known as:  ADVIL,MOTRIN  Take 400 mg by mouth every 6 (six) hours as needed for headache.     levETIRAcetam  500 MG tablet  Commonly known as:  KEPPRA  Take 1 tablet (500 mg total) by mouth every 12 (twelve) hours.     lisinopril 20 MG tablet  Commonly known as:  PRINIVIL,ZESTRIL  Take 20 mg by mouth daily.     Oxycodone HCl 10 MG Tabs  Take 1 tablet (10 mg total) by mouth every 4 (four) hours as needed for moderate pain.       Follow-up Information    Follow up with [facility], Donata Reddick, C, MD In 2 weeks.   Specialty:  Neurosurgery   Contact information:   [address] [place] [address] [phone]       Follow up with [facility], MD In 2 weeks.   Specialty:  Endocrinology   Contact information:   [place] [address] [phone]       Signed: Jairo Ben 05/01/2015, 9:14 AM

## 2015-05-01 NOTE — Progress Notes (Signed)
Patient very paranoid delusional calling operator on phone that people are in her  Room  Barricaded the door with chairs call placed to provider Haldol 1mg   Ordered given IM. Patient still pacing in room being monitored through glass door.

## 2015-05-05 ENCOUNTER — Encounter: Payer: Self-pay | Admitting: Endocrinology

## 2015-05-05 ENCOUNTER — Ambulatory Visit (INDEPENDENT_AMBULATORY_CARE_PROVIDER_SITE_OTHER): Payer: Medicare Other | Admitting: Endocrinology

## 2015-05-05 ENCOUNTER — Other Ambulatory Visit: Payer: Self-pay | Admitting: *Deleted

## 2015-05-05 VITALS — BP 130/85 | HR 83 | Temp 98.0°F | Resp 16 | Ht 65.5 in | Wt 170.0 lb

## 2015-05-05 DIAGNOSIS — E236 Other disorders of pituitary gland: Secondary | ICD-10-CM

## 2015-05-05 DIAGNOSIS — N390 Urinary tract infection, site not specified: Secondary | ICD-10-CM

## 2015-05-05 DIAGNOSIS — E232 Diabetes insipidus: Secondary | ICD-10-CM

## 2015-05-05 LAB — URINALYSIS, ROUTINE W REFLEX MICROSCOPIC
BILIRUBIN URINE: NEGATIVE
KETONES UR: NEGATIVE
Nitrite: NEGATIVE
Specific Gravity, Urine: <=1.005 — AB (ref 1.000–1.030)
Total Protein, Urine: NEGATIVE
URINE GLUCOSE: NEGATIVE
UROBILINOGEN UA: 0.2 (ref 0.0–1.0)
pH: 6 (ref 5.0–8.0)

## 2015-05-05 LAB — BASIC METABOLIC PANEL
BUN: 9 mg/dL (ref 6–23)
CHLORIDE: 94 meq/L — AB (ref 96–112)
CO2: 27 meq/L (ref 19–32)
Calcium: 9.6 mg/dL (ref 8.4–10.5)
Creatinine, Ser: 0.81 mg/dL (ref 0.40–1.20)
GFR: 75.59 mL/min (ref >60.00)
GLUCOSE: 101 mg/dL — AB (ref 70–99)
Potassium: 3.8 mEq/L (ref 3.5–5.1)
SODIUM: 131 meq/L — AB (ref 135–145)

## 2015-05-05 MED ORDER — NITROFURANTOIN MONOHYD MACRO 100 MG PO CAPS: 100.0000 mg | ORAL_CAPSULE | Freq: Two times a day (BID) | ORAL | Status: AC

## 2015-05-05 NOTE — Patient Instructions (Signed)
Take diazepam only as needed for anxiety  Take 1 tablet of HYDROCORTISONE in the morning and a half at dinnertime  Stop taking the desmopressin/DDAVP medication in the morning.  However if if you start urinating very frequently during the day may start the morning dose again Discuss bladder control with primary care physician  For the 24-hour urine test: Collect this on September 6 On September 5 and sixth do not take HYDROCORTISONE On September 5 and 6 take dexamethasone 1 tablet daily

## 2015-05-05 NOTE — Progress Notes (Deleted)
Subjective:     Patient ID: Kathy Tucker, female   DOB: [birth year], 64 y.o.   MRN: [id number]  HPI  DIABETES insipidus:   6-7x hs  Poor appetite no nausea weak This appears to be resolving and urine output is significantly better She is however getting hyponatremic with getting D5 water  Since her intake output is showing relatively positive balance will switch her for 1 L to using D5 normal saline Will also hold off on her DDAVP  ENDOCRINE: She does not have any decreased level of free T4 ruling out secondary hypothyroidism Since her cortisol level was done after starting Solu-Cortef it is not accurate and not representative of her endogenous secretion  Recommend the following for the weekend   Replace urine losses with equal amounts of fluid intake  Adjust IV fluids based on sodium levels, if she is drinking adequately she can go off IV fluids.  Continue to monitor sodium daily  If he continues to have excessive urinary output over 3 L a day she may be able to benefit from low doses of DDAVP is, 0.1 mg twice a day      Medication List       This list is accurate as of: 05/05/15  3:26 PM.  Always use your most recent med list.               amLODipine 10 MG tablet  Commonly known as:  NORVASC  Take 5 mg by mouth daily.     amLODipine 5 MG tablet  Commonly known as:  NORVASC     desmopressin 0.1 MG tablet  Commonly known as:  DDAVP  Take 1 tablet (0.1 mg total) by mouth 2 (two) times daily.     diazepam 5 MG tablet  Commonly known as:  VALIUM  Take 1 tablet (5 mg total) by mouth every 12 (twelve) hours as needed for anxiety.     hydrocortisone 20 MG tablet  Commonly known as:  CORTEF  Take 1 tablet (20 mg total) by mouth 2 (two) times daily.     ibuprofen 400 MG tablet  Commonly known as:  ADVIL,MOTRIN  Take 400 mg by mouth every 6 (six) hours as needed for headache.     levETIRAcetam 500 MG tablet  Commonly known as:  KEPPRA  Take 1 tablet (500 mg  total) by mouth every 12 (twelve) hours.     lisinopril 20 MG tablet  Commonly known as:  PRINIVIL,ZESTRIL  Take 20 mg by mouth daily.     Oxycodone HCl 10 MG Tabs  Take 1 tablet (10 mg total) by mouth every 4 (four) hours as needed for moderate pain.          Review of Systems     Objective:   Physical Exam     Assessment:     ***    Plan:     ***

## 2015-05-06 NOTE — Progress Notes (Signed)
Quick Note:  Please let patient know that the sodium test indicates she does not need the DDAVP tablets and she needs to stop for now, to call if she has more frequent urination during the daytime  ______

## 2015-05-07 MED ORDER — DEXAMETHASONE 0.75 MG PO TABS: ORAL_TABLET | ORAL | Status: AC

## 2015-05-07 NOTE — Progress Notes (Signed)
Patient ID: Kathy Tucker, female   DOB: 20-Dec-1950, 64 y.o.   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  She delivered postoperative diabetes insipidus and this continued for at least a day It appeared to improve after starting DDAVP, only 1 dose Her urine output had decreased significantly and because of her sodium starting to get low her DDAVP was stopped over the weekend However she was discharged in the hospital taking DDAVP 0.2 mg twice a day  Recent history: She said that she is getting up at night 6-7 times but frequently the urine volume is small and she tends to get urge incontinence No clear-cut burning symptoms with urination She does not have excessive urination during the daytime and is not excessively thirsty either She was told in the hospital to drink more water anyway  ENDOCRINE: She did not have a decreased level of free T4 ruling out secondary hypothyroidism Since her cortisol level was done after starting Solu-Cortef it is not clear whether she had any adrenal insufficiency but has been continued on hydrocortisone 20 mg twice a day at the time of discharge  Currently the patient is complaining of generally feeling weak, having decreased appetite but no nausea or other GI symptoms No lightheadedness.      Medication List       This list is accurate as of: 05/05/15 11:59 PM.  Always use your most recent med list.               amLODipine 10 MG tablet  Commonly known as:  NORVASC  Take 5 mg by mouth daily.     amLODipine 5 MG tablet  Commonly known as:  NORVASC     desmopressin 0.1 MG tablet  Commonly known as:  DDAVP  Take 1 tablet (0.1 mg total) by mouth 2 (two) times daily.     diazepam 5 MG tablet  Commonly known as:  VALIUM  Take 1 tablet (5 mg total) by mouth every 12 (twelve)  hours as needed for anxiety.     hydrocortisone 20 MG tablet  Commonly known as:  CORTEF  Take 1 tablet (20 mg total) by mouth 2 (two) times daily.     ibuprofen 400 MG tablet  Commonly known as:  ADVIL,MOTRIN  Take 400 mg by mouth every 6 (six) hours as needed for headache.     levETIRAcetam 500 MG tablet  Commonly known as:  KEPPRA  Take 1 tablet (500 mg total) by mouth every 12 (twelve) hours.     lisinopril 20 MG tablet  Commonly known as:  PRINIVIL,ZESTRIL  Take 20 mg by mouth daily.     nitrofurantoin (macrocrystal-monohydrate) 100 MG capsule  Commonly known as:  MACROBID  Take 1 capsule (100 mg total) by mouth 2 (two) times daily.     Oxycodone HCl 10 MG Tabs  Take 1 tablet (10 mg total) by mouth every 4 (four) hours as needed for moderate pain.        Allergies:  Allergies  Allergen Reactions  . Sulfa Antibiotics Rash    Hives  . Bupropion   . Citalopram Hydrobromide   . Fluoxetine Hcl     Past Medical History  Diagnosis Date  . Hypertension   . Dysrhythmia     heart rate speeds  up, usually with anixety  . Shortness of breath dyspnea     with exertion  . Anxiety   . Depression   . Short-term memory loss     Past 8 months  . GERD (gastroesophageal reflux disease)     has taken Zantac- not recently  . Arthritis   . History of blood transfusion     after csection    Past Surgical History  Procedure Laterality Date  . Cesarean section      x 3  . Cyst excision Right     from hand  . Cardiac electrophysiology study and ablation      in [place] ? 2008 ish  . Craniotomy Right 04/25/2015    Procedure: Right stereotactic frontotemporal craniotomy for Resection of Tumor;  Surgeon: Consuella Lose, MD;  Location: [facility];  Service: Neurosurgery;  Laterality: Right;  Right stereotactic frontotemporal craniotomy for resection of tumor with brain lab  . Application of cranial navigation Right 04/25/2015    Procedure: APPLICATION OF CRANIAL  NAVIGATION;  Surgeon: Consuella Lose, MD;  Location: [facility];  Service: Neurosurgery;  Laterality: Right;    No family history on file.  Social History:  reports that she has been smoking.  She does not have any smokeless tobacco history on file. She reports that she does not drink alcohol or use illicit drugs.  Review of Systems   Review of Systems  Eyes: Negative for blurred vision.  Cardiovascular: Negative for leg swelling.  Genitourinary: Negative for dysuria.  Neurological: Positive for weakness. Negative for dizziness and headaches.  Psychiatric/Behavioral: The patient is nervous/anxious.        She feels very anxious, was given Valium 5 mg twice a day which she is taking regularly since discharge   VITAL signs:  BP 130/85 mmHg  Pulse 83  Temp(Src) 98 F (36.7 C)  Resp 16  Ht 5' 5.5" (1.664 m)  Wt 170 lb (77.111 kg)  BMI 27.85 kg/m2  SpO2 95%  Physical Exam  Her blood pressure was not low standing up She is alert and in no acute distress Mucous membranes are moist No pedal edema Heart sounds are regular   Assessment/Plan:    DIABETES insipidus: This did not appear to be persistent after the first couple of days of surgery.  Treatment with DDAVP tended to cause hyponatremia.  Currently the patient is taking DDAVP but is not having excessive urination.  She needs to have her sodium level reassessed today and most likely since she was not having excessive urination before discharge will need to give her a trial off the DDAVP.  Urge incontinence and nocturia.  She probably has an overactive bladder but may have an infection.  Urine dipstick in the office shows positive leukocytes and she will start Macrobid 100 mg twice a day for the next 5 days.  Urinalysis to be checked today  ?  Adrenal deficiency.  Clinically does not appear to be having symptoms or signs today.  This diagnosis is also uncertain as her cortisol was not assessed before starting steroids in  the hospital.  Will  reduce her steroids to physiological doses of 30 mg a day and she will have urine collection for cortisol while taking dexamethasone in about 10 days  Discussed that she needs to follow-up with PCP for various issues including hypertension  Malaise and decreased appetite may be postoperative  Total visit time including review of hospitalization, previous labs, exam and counseling = 25 minutes  Bryant Saye 05/07/2015, 9:28 PM

## 2015-05-08 ENCOUNTER — Telehealth: Payer: Self-pay | Admitting: Endocrinology

## 2015-05-08 NOTE — Telephone Encounter (Signed)
Rx for Macrobid was e-sent to patients pharmacy on 05/05/15, receipt confimed by pharmacy at 5:03 pm. Message left for patient

## 2015-05-08 NOTE — Telephone Encounter (Signed)
Pt states at appt we diagnosed her with bladder infection but antibiotic was not at pharmacy please advise, please call into [facility]

## 2015-05-09 ENCOUNTER — Encounter: Payer: Self-pay | Admitting: *Deleted

## 2015-05-22 ENCOUNTER — Ambulatory Visit: Payer: Medicare Other | Admitting: Endocrinology

## 2015-05-25 ENCOUNTER — Encounter: Payer: Self-pay | Admitting: Endocrinology

## 2015-05-25 ENCOUNTER — Ambulatory Visit (INDEPENDENT_AMBULATORY_CARE_PROVIDER_SITE_OTHER): Payer: Medicare Other | Admitting: Endocrinology

## 2015-05-25 ENCOUNTER — Other Ambulatory Visit (INDEPENDENT_AMBULATORY_CARE_PROVIDER_SITE_OTHER): Payer: Medicare Other

## 2015-05-25 VITALS — BP 136/92 | HR 78 | Temp 98.4°F | Resp 16 | Ht 65.5 in | Wt 167.8 lb

## 2015-05-25 DIAGNOSIS — E232 Diabetes insipidus: Secondary | ICD-10-CM

## 2015-05-25 DIAGNOSIS — E236 Other disorders of pituitary gland: Secondary | ICD-10-CM

## 2015-05-25 LAB — BASIC METABOLIC PANEL
BUN: 14 mg/dL (ref 6–23)
CHLORIDE: 110 meq/L (ref 96–112)
CO2: 27 meq/L (ref 19–32)
CREATININE: 0.97 mg/dL (ref 0.40–1.20)
Calcium: 9.8 mg/dL (ref 8.4–10.5)
GFR: 61.38 mL/min (ref >60.00)
Glucose, Bld: 84 mg/dL (ref 70–99)
Potassium: 4 mEq/L (ref 3.5–5.1)
Sodium: 145 mEq/L (ref 135–145)

## 2015-05-25 LAB — T4, FREE: Free T4: 0.66 ng/dL (ref 0.60–1.60)

## 2015-05-25 MED ORDER — OXYBUTYNIN CHLORIDE 5 MG PO TABS: ORAL_TABLET | ORAL | Status: AC

## 2015-05-25 NOTE — Patient Instructions (Signed)
For the 24-hour urine test:   On the day before and the day of the urine collection do not take HYDROCORTISONE On  these 2 days take the dexamethasone 1 tablet daily in the morning After urine collection go back to the hydrocortisone 1 tablet in the morning and half in the evening as before    oxybutynin is a new medication to take at bedtime to help reduce urination

## 2015-05-25 NOTE — Progress Notes (Signed)
Patient ID: Kathy Tucker, female   DOB: 03-28-1951, 64 y.o.   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  She postoperative diabetes insipidus and this continued for at least a day It appeared to improve after starting DDAVP, only 1 dose Her urine output had decreased significantly and because of her sodium starting to get low her DDAVP was stopped over the weekend Although she was discharged in the hospital taking DDAVP 0.2 mg twice a day this was stopped on her initial office visit because of hyponatremia despite her urine specific gravity being relatively low  Recent history:  She was given Macrodantin empirically for possible UTI on her initial office visit She still has tendency to frequent urination at night but this is more of a sense of urgency and some incontinence Urine volumes are variable at night She does not think she has frequent urination during the daytime She has not been drinking excessive amounts of water and does not complain of thirst  She does not have excessive urination during the daytime and is not excessively thirsty either   Lab Results  Component Value Date   CREATININE 0.81 05/05/2015   BUN 9 05/05/2015   NA 131* 05/05/2015   K 3.8 05/05/2015   CL 94* 05/05/2015   CO2 27 05/05/2015    ENDOCRINE: She did not have a decreased level of free T4 ruling out secondary hypothyroidism Since her cortisol level was done after starting Solu-Cortef it is not clear whether she had any adrenal insufficiency but has been continued on hydrocortisone 20 mg twice a day at the time of discharge  Currently the patient is feeling somewhat better with her energy level and appetite She was told to collect a urine 24 hour creatinine but she did not take the dexamethasone for the 2 days prior to starting her  collection and is still taking hydrocortisone No lightheadedness.  Lab Results  Component Value Date   FREET4 0.98 04/27/2015         Medication List       This list is accurate as of: 05/25/15  1:35 PM.  Always use your most recent med list.               amLODipine 10 MG tablet  Commonly known as:  NORVASC  Take 5 mg by mouth daily.     amLODipine 5 MG tablet  Commonly known as:  NORVASC     desmopressin 0.1 MG tablet  Commonly known as:  DDAVP  Take 1 tablet (0.1 mg total) by mouth 2 (two) times daily.     dexamethasone 0.75 MG tablet  Commonly known as:  DECADRON  Take 1 tablet daily for 2 days starting the day before urine collection     diazepam 5 MG tablet  Commonly known as:  VALIUM  Take 1 tablet (5 mg total) by mouth every 12 (twelve) hours as needed for anxiety.     hydrocortisone 20 MG tablet  Commonly known as:  CORTEF  Take 1 tablet (20 mg total) by mouth 2 (two) times daily.     ibuprofen 400 MG tablet  Commonly known as:  ADVIL,MOTRIN  Take 400 mg by mouth every 6 (six) hours as needed for headache.     levETIRAcetam 500 MG tablet  Commonly known as:  KEPPRA  Take 1 tablet (500 mg total) by mouth every 12 (twelve) hours.     lisinopril 20 MG tablet  Commonly known as:  PRINIVIL,ZESTRIL  Take 20 mg by mouth daily.     nitrofurantoin (macrocrystal-monohydrate) 100 MG capsule  Commonly known as:  MACROBID  Take 1 capsule (100 mg total) by mouth 2 (two) times daily.     Oxycodone HCl 10 MG Tabs  Take 1 tablet (10 mg total) by mouth every 4 (four) hours as needed for moderate pain.        Allergies:  Allergies  Allergen Reactions  . Sulfa Antibiotics Rash    Hives  . Bupropion   . Citalopram Hydrobromide   . Fluoxetine Hcl     Past Medical History  Diagnosis Date  . Hypertension   . Dysrhythmia     heart rate speeds up, usually with anixety  . Shortness of breath dyspnea     with exertion  . Anxiety   . Depression   .  Short-term memory loss     Past 8 months  . GERD (gastroesophageal reflux disease)     has taken Zantac- not recently  . Arthritis   . History of blood transfusion     after csection    Past Surgical History  Procedure Laterality Date  . Cesarean section      x 3  . Cyst excision Right     from hand  . Cardiac electrophysiology study and ablation      in [place] ? 2008 ish  . Craniotomy Right 04/25/2015    Procedure: Right stereotactic frontotemporal craniotomy for Resection of Tumor;  Surgeon: Consuella Lose, MD;  Location: [facility];  Service: Neurosurgery;  Laterality: Right;  Right stereotactic frontotemporal craniotomy for resection of tumor with brain lab  . Application of cranial navigation Right 04/25/2015    Procedure: APPLICATION OF CRANIAL NAVIGATION;  Surgeon: Consuella Lose, MD;  Location: [facility];  Service: Neurosurgery;  Laterality: Right;    No family history on file.  Social History:  reports that she has been smoking.  She does not have any smokeless tobacco history on file. She reports that she does not drink alcohol or use illicit drugs.  Review of Systems   Review of Systems  Cardiovascular: Negative for leg swelling.  Gastrointestinal: Negative for nausea.   VITAL signs:  BP 136/92 mmHg  Pulse 78  Temp(Src) 98.4 F (36.9 C)  Resp 16  Ht 5' 5.5" (1.664 m)  Wt 167 lb 12.8 oz (76.114 kg)  BMI 27.49 kg/m2  SpO2 97%  Physical Exam  140/84 standing  No pedal edema   Assessment/Plan:    DIABETES insipidus: This did not appear to be present now and was probably transient after surgery.  With stopping her DDAVP she is not having polyuria during the day and no excessive thirst.  Serum sodium will need to be assessed today.  Urge incontinence and nocturia.  She probably has an overactive bladder and will benefit from a trial of oxybutynin until she sees her PCP  ?  Adrenal deficiency.  This will need to be readdressed.  Discussed that  we need to like her urine over 24 hours for the cortisol measurement when she is on dexamethasone 0.75 and she will do this now and take her urine collection to the PCP for processing  ?  Hypopituitarism.  Will check her free T4 also today    Kathy Tucker 05/25/2015,  1:35 PM    Addendum: Sodium 145, free T4 low normal will start levothyroxine 25 g daily.  Will need to have her follow-up in about 2 months

## 2015-05-25 NOTE — Progress Notes (Signed)
Quick Note:  Please let patient know that the sodium is okay, since thyroid level is slightly low now needs to start levothyroxine 25 g daily  ______

## 2015-05-26 ENCOUNTER — Other Ambulatory Visit: Payer: Self-pay | Admitting: *Deleted

## 2015-05-26 MED ORDER — LEVOTHYROXINE SODIUM 25 MCG PO TABS: 25.0000 ug | ORAL_TABLET | Freq: Every day | ORAL | Status: AC

## 2015-05-26 NOTE — Progress Notes (Addendum)
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 - SEE ONCOLOGY TABLE. 2. 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 - SEE ONCOLOGY TABLE. Microscopic Comment  Patient presented with symptoms of:   CRANIOTOMY TUMOR EXCISION Past or anticipated interventions, if any, per neurosurgery:04/25/15 Dr.Neelesh Nundkumar, no follow up appt as yet  Past or anticipated interventions, if any, per medical oncology:  Dose of Decadron, if applicable: none  Recent neurologic symptoms, if any:   Seizures No  Headaches: Yes  Nausea: No  Dizziness/ataxia: yes light headed at times   Difficulty with hand coordination: no  Focal numbness/weakness: hands numbness at night at times   Visual deficits/changes:  No   Confusion/Memory deficits:NO  Painful bone metastases at present, if any:low back pain,   SAFETY ISSUES:yes, 4 falls in last year,    Prior radiation? No  Pacemaker/ICD? No  Possible current pregnancy? NO  Is the patient on methotrexate? No  Additional Complaints / other details: Divorced, G42P4, menarche age 40, age 61st live birth age 55, no family hx cancer in family,  1ppd 1years, rare socail drink alcohol, no smokeless tobacco, no illicit drug, Depressed Scar above right eye on forehead healed  Allergies:sulfa, bupropion,fluoxetine and citalopram 7:46 AM  BP 151/77 mmHg  Pulse 83  Temp(Src) 97.8 F (36.6 C) (Oral)  Resp 20  Ht 5' 5.5" (1.664 m)  Wt 167 lb 3.2 oz (75.841 kg)  BMI 27.39 kg/m2  SpO2 100%  Wt Readings from Last 3 Encounters:  05/29/15 167 lb 3.2 oz (75.841 kg)  05/25/15 167 lb 12.8 oz (76.114 kg)  05/05/15 170 lb (77.111 kg)

## 2015-05-29 ENCOUNTER — Ambulatory Visit
Admission: RE | Admit: 2015-05-29 | Discharge: 2015-05-29 | Disposition: A | Discharge status: Home / Self Care | Payer: Medicare Other | Source: Ambulatory Visit | Attending: Radiation Oncology | Admitting: Radiation Oncology

## 2015-05-29 ENCOUNTER — Encounter: Payer: Self-pay | Admitting: Radiation Oncology

## 2015-05-29 VITALS — BP 151/77 | HR 83 | Temp 97.8°F | Resp 20 | Ht 65.5 in | Wt 167.2 lb

## 2015-05-29 DIAGNOSIS — C7949 Secondary malignant neoplasm of other parts of nervous system: Principal | ICD-10-CM

## 2015-05-29 DIAGNOSIS — R0600 Dyspnea, unspecified: Secondary | ICD-10-CM | POA: Diagnosis not present

## 2015-05-29 DIAGNOSIS — C7931 Secondary malignant neoplasm of brain: Secondary | ICD-10-CM

## 2015-05-29 DIAGNOSIS — M1990 Unspecified osteoarthritis, unspecified site: Secondary | ICD-10-CM | POA: Insufficient documentation

## 2015-05-29 DIAGNOSIS — F419 Anxiety disorder, unspecified: Secondary | ICD-10-CM | POA: Diagnosis not present

## 2015-05-29 DIAGNOSIS — F329 Major depressive disorder, single episode, unspecified: Secondary | ICD-10-CM | POA: Diagnosis not present

## 2015-05-29 DIAGNOSIS — F17200 Nicotine dependence, unspecified, uncomplicated: Secondary | ICD-10-CM | POA: Insufficient documentation

## 2015-05-29 DIAGNOSIS — D444 Neoplasm of uncertain behavior of craniopharyngeal duct: Secondary | ICD-10-CM | POA: Insufficient documentation

## 2015-05-29 DIAGNOSIS — I10 Essential (primary) hypertension: Secondary | ICD-10-CM | POA: Diagnosis not present

## 2015-05-29 DIAGNOSIS — K219 Gastro-esophageal reflux disease without esophagitis: Secondary | ICD-10-CM | POA: Diagnosis not present

## 2015-05-29 DIAGNOSIS — Z510 Encounter for antineoplastic radiation therapy: Secondary | ICD-10-CM | POA: Diagnosis present

## 2015-05-29 MED ORDER — LORAZEPAM 1 MG PO TABS: ORAL_TABLET | ORAL | Status: AC

## 2015-05-29 NOTE — Progress Notes (Signed)
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 female who presented with falls, dizziness, tipping to right side, and personality/cognitive changes.  She was found to have a suprasellar mass on imaging.  Subtotal resection was performed by Dr Nundkumar 4 weeks ago. She reports that her symptoms improved significantly since surgery.  She had transient worsening of her left sided vision after surgery, but that symptom has dissipated.  She currently is a little lightheaded with standing but not falling any longer.  No severe HAs.   She has been discussed at CNS tumor board.  Post op MRI revealed  significant residual tumor in the suprasellar region; there is significant motion artifact but it appears close to the chiasm.  Pathology (04-25-15) revealed: Diagnosis 1. 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 - SEE ONCOLOGY TABLE. 2. 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 - SEE ONCOLOGY TABLE. Microscopic Comment 1. ONCOLOGY TABLE - BRAIN AND SPINAL CORD 1. Procedure: Supracellar/hypothalamic tumor resection. 2. Tumor site, including laterality: Suprasellar/hypothalamic. 3. Maximum tumor size (cm): 1.5 cm aggregate of tissue. Imaging with 3 cm mass. 4. Histologic type: Craniopharyngioma, papillary type. 5. Grade: WHO grade I. 6. Margins (if applicable): Can not be assessed 7. Ancillary studies: Not performed. 8. Comment: Dr. Donato Heinz has reviewed the case.  PREVIOUS RADIATION THERAPY: No  PAST MEDICAL HISTORY:  has a past medical history of Hypertension; Dysrhythmia;  Shortness of breath dyspnea; Anxiety; Depression; Short-term memory loss; GERD (gastroesophageal reflux disease); Arthritis; and History of blood transfusion.    PAST SURGICAL HISTORY: Past Surgical History  Procedure Laterality Date  . Cesarean section      x 3  . Cyst excision Right     from hand  . Cardiac electrophysiology study and ablation      in [place] ? 2008 ish  . Craniotomy Right 04/25/2015    Procedure: Right stereotactic frontotemporal craniotomy for Resection of Tumor;  Surgeon: Consuella Lose, MD;  Location: [facility];  Service: Neurosurgery;  Laterality: Right;  Right stereotactic frontotemporal craniotomy for resection of tumor with brain lab  . Application of cranial navigation Right 04/25/2015    Procedure: APPLICATION OF CRANIAL NAVIGATION;  Surgeon: Consuella Lose, MD;  Location: [facility];  Service: Neurosurgery;  Laterality: Right;    FAMILY HISTORY: family history is not on file.  SOCIAL HISTORY:  reports that she has been smoking.  She does not have any smokeless tobacco history on file. She reports that she does not drink alcohol or use illicit drugs.  ALLERGIES: Sulfa antibiotics; Bupropion; Citalopram hydrobromide; and Fluoxetine hcl  MEDICATIONS:  Current Outpatient Prescriptions  Medication Sig Dispense Refill  . aspirin 325 MG tablet Take 325 mg by mouth every 6 (six) hours as needed.    . levETIRAcetam (KEPPRA) 500 MG tablet Take 1 tablet (500 mg total) by mouth every 12 (twelve) hours. 60 tablet 0  . lisinopril (PRINIVIL,ZESTRIL) 20 MG tablet Take 20 mg by mouth daily.    Marland Kitchen amLODipine (NORVASC) 10 MG tablet Take 5 mg by mouth daily.     Marland Kitchen desmopressin (DDAVP) 0.1 MG tablet Take 100 mcg by mouth  2 (two) times daily.  1  . dexamethasone (DECADRON) 0.75 MG tablet Take 1 tablet daily for 2 days starting the day before urine collection (Patient not taking: Reported on 05/25/2015) 2 tablet 0  . diazepam (VALIUM) 5 MG tablet Take 1 tablet (5 mg  total) by mouth every 12 (twelve) hours as needed for anxiety. (Patient not taking: Reported on 05/29/2015) 30 tablet 0  . hydrocortisone (CORTEF) 20 MG tablet Take 1 tablet (20 mg total) by mouth 2 (two) times daily. 60 tablet 1  . ibuprofen (ADVIL,MOTRIN) 400 MG tablet Take 400 mg by mouth every 6 (six) hours as needed for headache.    . levothyroxine (SYNTHROID, LEVOTHROID) 25 MCG tablet Take 1 tablet (25 mcg total) by mouth daily before breakfast. (Patient not taking: Reported on 05/29/2015) 30 tablet 5  . nitrofurantoin, macrocrystal-monohydrate, (MACROBID) 100 MG capsule Take 1 capsule (100 mg total) by mouth 2 (two) times daily. (Patient not taking: Reported on 05/25/2015) 10 capsule 0  . oxybutynin (DITROPAN) 5 MG tablet 1 tab hs (Patient not taking: Reported on 05/29/2015) 30 tablet 1  . oxyCODONE 10 MG TABS Take 1 tablet (10 mg total) by mouth every 4 (four) hours as needed for moderate pain. (Patient not taking: Reported on 05/29/2015) 30 tablet 0   No current facility-administered medications for this encounter.    REVIEW OF SYSTEMS:  Notable for that above.   PHYSICAL EXAM:  height is 5' 5.5" (1.664 m) and weight is 167 lb 3.2 oz (75.841 kg). Her oral temperature is 97.8 F (36.6 C). Her blood pressure is 151/77 and her pulse is 83. Her respiration is 20 and oxygen saturation is 100%.   General: Alert and oriented, in no acute distress HEENT: Head is normocephalic. Extraocular movements are intact. Oropharynx is clear. Neck: Neck is supple, no palpable cervical or supraclavicular lymphadenopathy. Heart: Regular in rate and rhythm with no murmurs, rubs, or gallops. Chest: Clear to auscultation bilaterally, with no rhonchi, wheezes, or rales. Abdomen: Soft, diffusely /mildly tender, nondistended, with no rigidity or guarding. Extremities: No cyanosis or edema. Lymphatics: see Neck Exam Skin: No concerning lesions. Musculoskeletal: symmetric strength and muscle tone  throughout. Neurologic: Cranial nerves II through XII are grossly intact. No obvious focalities. Speech is fluent. Coordination is intact. OBJECT RECALL 2/3 at 72min Psychiatric: Judgment and insight are intact. Affect is appropriate.   KPS = 70  100 - Normal; no complaints; no evidence of disease. 90   - Able to carry on normal activity; minor signs or symptoms of disease. 80   - Normal activity with effort; some signs or symptoms of disease. 11   - Cares for self; unable to carry on normal activity or to do active work. 60   - Requires occasional assistance, but is able to care for most of his personal needs. 50   - Requires considerable assistance and frequent medical care. 79   - Disabled; requires special care and assistance. 56   - Severely disabled; hospital admission is indicated although death not imminent. 57   - Very sick; hospital admission necessary; active supportive treatment necessary. 10   - Moribund; fatal processes progressing rapidly. 0     - Dead  Karnofsky DA, Abelmann [facility], Craver LS and Burchenal [facility] [phone]) The use of the nitrogen mustards in the palliative treatment of carcinoma: with particular reference to bronchogenic carcinoma Cancer 1 [id number]   LABORATORY DATA:  Lab Results  Component Value Date   WBC 10.0 04/17/2015  HGB 11.6* 04/25/2015   HCT 34.0* 04/25/2015   MCV 90.3 04/17/2015   PLT 314 04/17/2015   CMP     Component Value Date/Time   NA 145 05/25/2015 1353   K 4.0 05/25/2015 1353   CL 110 05/25/2015 1353   CO2 27 05/25/2015 1353   GLUCOSE 84 05/25/2015 1353   BUN 14 05/25/2015 1353   CREATININE 0.97 05/25/2015 1353   CALCIUM 9.8 05/25/2015 1353   GFRNONAA >60 04/30/2015 0220   GFRAA >60 04/30/2015 0220         RADIOGRAPHY: as above    IMPRESSION/PLAN:Today, I talked to the patient about the findings and work-up thus far. We discussed the patient's diagnosis of craniopharyngioma and general treatment for this, highlighting the role of  radiotherapy in the management. Her local control is estimated at 30% without adjuvant RT and 70-90% with adjuvant RT. We discussed the available radiation techniques, and focused on the details of logistics and delivery.  We discussed transportation to approximately 6 weeks of EBRT treatment (IMRT technique).  She lives in [place] and prefers RT at [facility].   Will order restaging MRI, SRS protocol, with Ativan to minimize movement during imaging.  If this shows that disease is infact amenable to 5 fractions of SRS at [facility] ( this is doubtful but not entirely impossible) I will let her know. Tentative plan is to followup at [facility] after MRI.  We discussed the risks, benefits, and side effects of radiotherapy. Side effects may include but not necessarily be limited to: fatigue, hair loss, skin irritation, cognitive decline, endocrine issues, visual deficits  No guarantees of treatment were given. A consent form will be signed before simulation. The patient was encouraged to ask questions that I answered to the best of my ability. She is enthusiastic about our plan.  She is seeing endocrinology already. Will refer to ophthalmology for baseline exam and monitoring.    __________________________________________   Eppie Gibson, MD

## 2015-05-29 NOTE — Progress Notes (Signed)
Please see the Nurse Progress Note in the MD Initial Consult Encounter for this patient. 

## 2015-05-30 ENCOUNTER — Other Ambulatory Visit: Payer: Self-pay | Admitting: Radiation Therapy

## 2015-05-30 ENCOUNTER — Other Ambulatory Visit: Payer: Self-pay | Admitting: Radiation Oncology

## 2015-05-30 DIAGNOSIS — C7931 Secondary malignant neoplasm of brain: Secondary | ICD-10-CM

## 2015-05-30 DIAGNOSIS — D444 Neoplasm of uncertain behavior of craniopharyngeal duct: Secondary | ICD-10-CM

## 2015-06-02 ENCOUNTER — Encounter: Payer: Self-pay | Admitting: *Deleted

## 2015-06-02 NOTE — Progress Notes (Signed)
[place] Psychosocial Distress Screening Clinical Social Work  Clinical Social Work was referred by distress screening protocol.  The patient scored a 7 on the Psychosocial Distress Thermometer which indicates severe distress. Clinical Social Worker phoned pt to assess for distress and other psychosocial needs. CSW spoke to pt's daughter as that was number listed in EPIC. CSW provided daughter with contacts of resources to assist them in their area such as Duanne Limerick, ACS and CSW team at [facility]. Daughter very appreciative and will get pt to phone CSW as needed.   ONCBCN DISTRESS SCREENING 05/29/2015  Screening Type Initial Screening  Distress experienced in past week (1-10) 7  Emotional problem type Depression;Adjusting to illness;Nervousness/Anxiety  Physical Problem type Pain;Loss of appetitie;Talking;Changes in urination  Physician notified of physical symptoms Yes  Referral to clinical social work Yes  Referral to dietition Yes    Clinical Social Worker follow up needed: No.  If yes, follow up plan: Loren Racer, [place]  [facility] Phone: [phone] Fax: [phone]

## 2015-06-07 ENCOUNTER — Ambulatory Visit
Admission: RE | Admit: 2015-06-07 | Discharge: 2015-06-07 | Disposition: A | Discharge status: Home / Self Care | Payer: Medicare Other | Source: Ambulatory Visit | Attending: Radiation Oncology | Admitting: Radiation Oncology

## 2015-06-07 DIAGNOSIS — C7931 Secondary malignant neoplasm of brain: Secondary | ICD-10-CM

## 2015-06-07 MED ORDER — GADOBENATE DIMEGLUMINE 529 MG/ML IV SOLN
14.0000 mL | Freq: Once | INTRAVENOUS | Status: AC | PRN
  Administered 2015-06-07: 14 mL via INTRAVENOUS

## 2015-09-29 ENCOUNTER — Other Ambulatory Visit: Payer: Self-pay | Admitting: Radiation Therapy

## 2015-09-29 DIAGNOSIS — D444 Neoplasm of uncertain behavior of craniopharyngeal duct: Secondary | ICD-10-CM

## 2015-10-05 ENCOUNTER — Encounter: Payer: Self-pay | Admitting: Radiation Therapy

## 2015-10-05 NOTE — Progress Notes (Signed)
Received a call from Salix from [facility] today stating that pt does not remember my call informing her of the upcoming MRI and visit with Dr. Kathyrn Sheriff.   I am sending out a note in the mail on 10/05/15, to ensure that she is aware.   10/30/15 -MRI at [facility]- arrive at 12:30 for a 1:00 scan 11/01/15- Follow-up with Dr. Kathyrn Sheriff at his office on [address]  @ 1:00.   For the patient's convenience, Crystal from Dr. Cleotilde Neer office, has faxed over lab orders to [facility] for her pituitary workup. She  has also sent the order to the pt directly as a reminder to have them drawn.   Mont Dutton

## 2016-02-22 ENCOUNTER — Other Ambulatory Visit: Payer: Self-pay | Admitting: Radiation Oncology

## 2016-04-01 ENCOUNTER — Other Ambulatory Visit: Payer: Self-pay | Admitting: Radiation Therapy

## 2016-04-01 DIAGNOSIS — D444 Neoplasm of uncertain behavior of craniopharyngeal duct: Secondary | ICD-10-CM

## 2016-05-06 ENCOUNTER — Ambulatory Visit
Admission: RE | Admit: 2016-05-06 | Discharge: 2016-05-06 | Disposition: A | Discharge status: Home / Self Care | Payer: Medicare Other | Source: Ambulatory Visit | Attending: Radiation Oncology | Admitting: Radiation Oncology

## 2016-05-06 DIAGNOSIS — D444 Neoplasm of uncertain behavior of craniopharyngeal duct: Secondary | ICD-10-CM

## 2016-05-06 MED ORDER — GADOBENATE DIMEGLUMINE 529 MG/ML IV SOLN: 10.0000 mL | Freq: Once | INTRAVENOUS | Status: DC | PRN

## 2016-05-17 ENCOUNTER — Inpatient Hospital Stay: Admit: 2016-05-17 | Attending: Orthopaedic Surgery | Primary: Family Medicine

## 2016-05-17 NOTE — Other (Unsigned)
Patient Acct Nbr: [id number]   Primary AUTH/CERT: [id number]  Primary Insurance Company Name: [employer] Plan name: [facility]  Primary Insurance Group Number: [facility]  Primary Insurance Plan Type: Health  Primary Insurance Policy Number: [id number]    Secondary AUTH/CERT:   Secondary Insurance Company Name: [employer] Plan name: [facility] [employer] Second  Secondary Insurance Group Number: [employer]  Secondary Insurance Plan Type: Health  Secondary Insurance Policy Number: [id number]

## 2016-07-04 IMAGING — MR MR HEAD WO/W CM
11 of 13 series · 33 of 48 positions shown · IV contrast (multihance)
Comparison: 04/21/2015

CLINICAL DATA: Suprasellar tumor status post resection 04/25/2015,
likely craniopharyngioma.

EXAM:
MRI HEAD WITHOUT AND WITH CONTRAST
TECHNIQUE: Multiplanar, multiecho pulse sequences of the brain and surrounding
structures were obtained without and with intravenous contrast.
CONTRAST:  16 mL MultiHance

[Series 2: FLAIR · sagittal · 5.0mm · 0.47mm/px · 3 of 28 slices shown (1 of 2)]
[im 1/28]
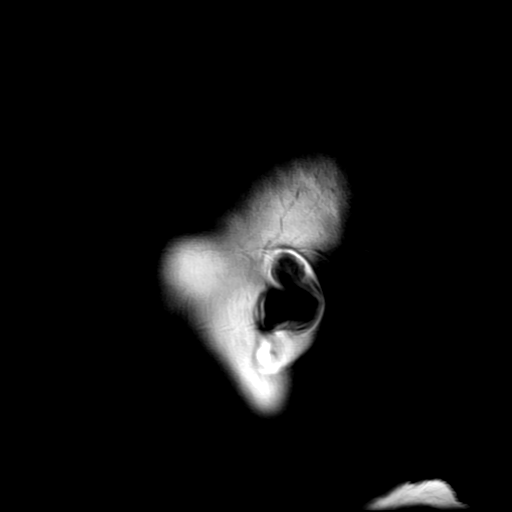
[im 14/28]
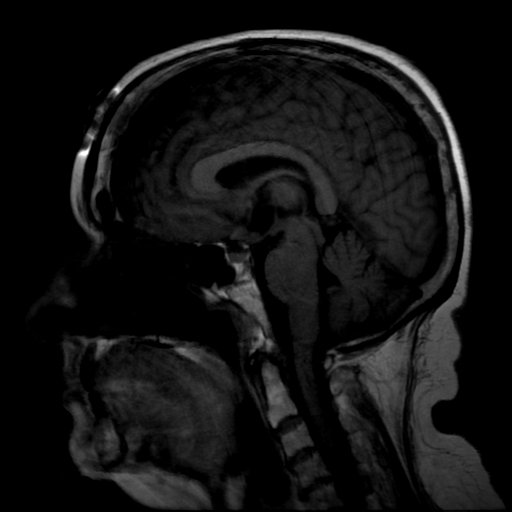
[im 28/28]
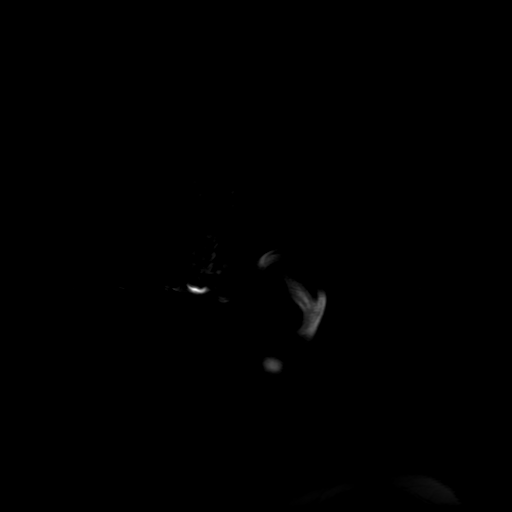

[Series 4: DWI · axial · 3.0mm · 0.94mm/px · z∈[-36,+99]mm · 7 of 94 slices shown (1 of 4)]
[im 1/94]
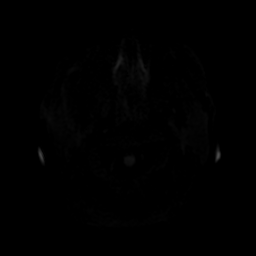
[im 16/94]
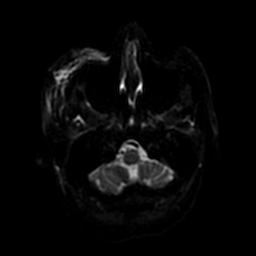
[im 32/94]
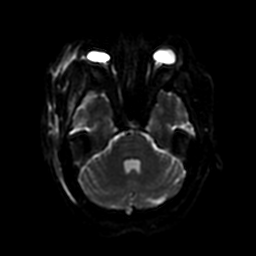
[im 47/94]
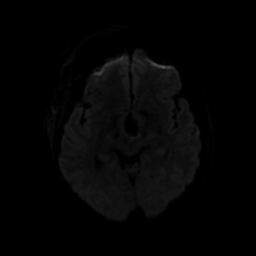
[im 63/94]
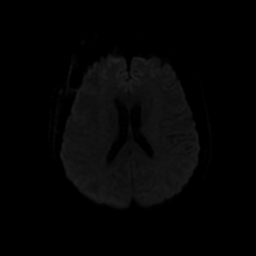
[im 78/94]
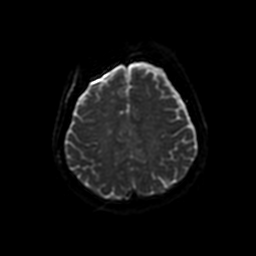
[im 94/94]
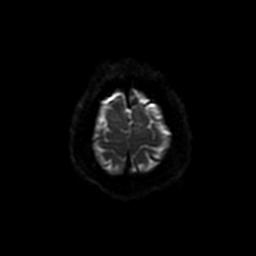

[Series 5: T2 · axial · 5.0mm · 0.47mm/px · z∈[-48,+99]mm · 2 of 26 slices shown (1 of 2)]
[im 1/26]
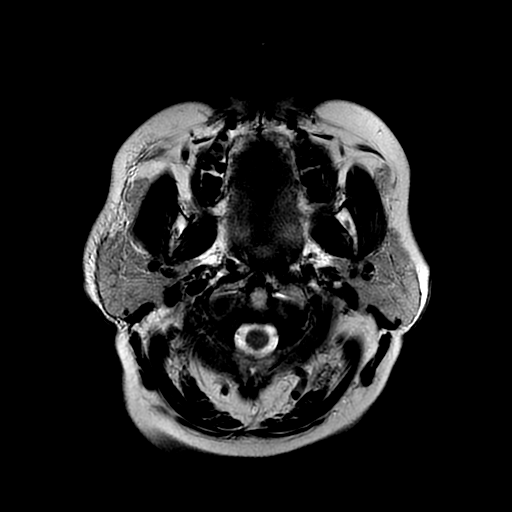
[im 26/26]
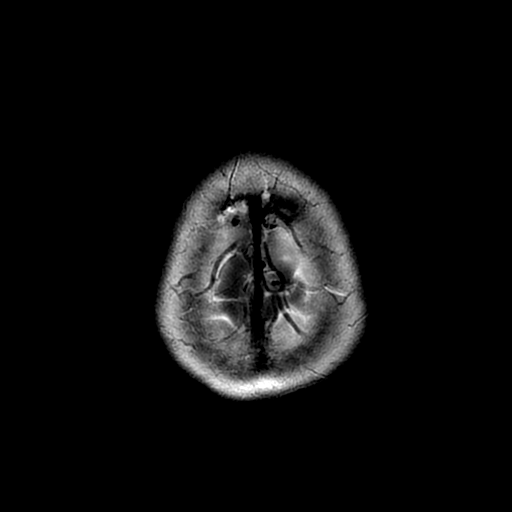

[Series 6: FLAIR · axial · 5.0mm · 0.47mm/px · z∈[-48,+99]mm · 2 of 26 slices shown (2 of 2)]
[im 1/26]
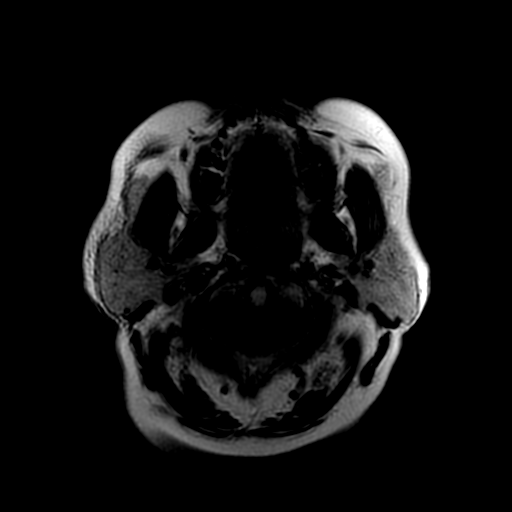
[im 26/26]
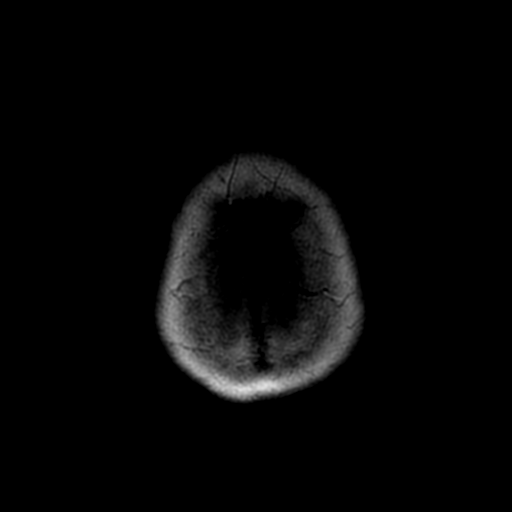

[Series 7: DWI · coronal · 5.0mm · 0.94mm/px · 5 of 63 slices shown (2 of 4)]
[im 1/63]
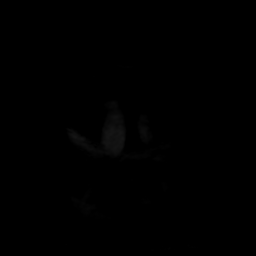
[im 16/63]
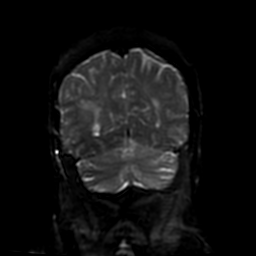
[im 32/63]
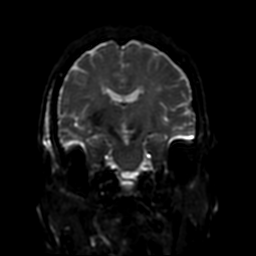
[im 47/63]
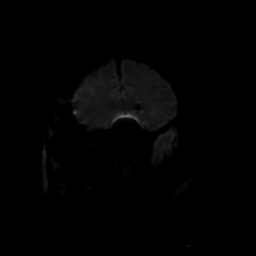
[im 63/63]
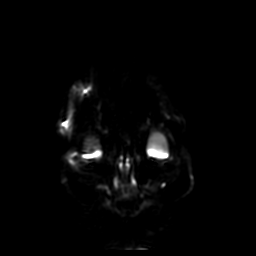

[Series 10: T2 · coronal · 5.0mm · 0.47mm/px · 2 of 27 slices shown (2 of 2)]
[im 1/27]
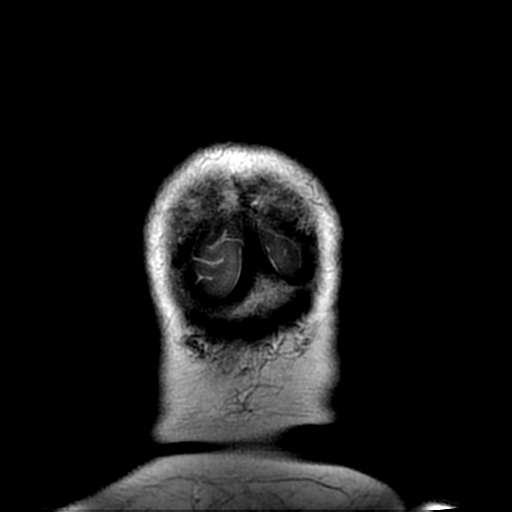
[im 27/27]
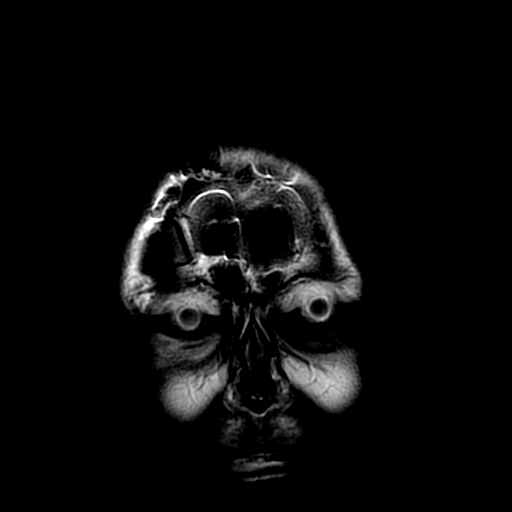

[Series 11: T1 · axial · 5.0mm · 0.47mm/px · z∈[-48,+99]mm · 2 of 26 slices shown (1 of 2)]
[im 1/26]
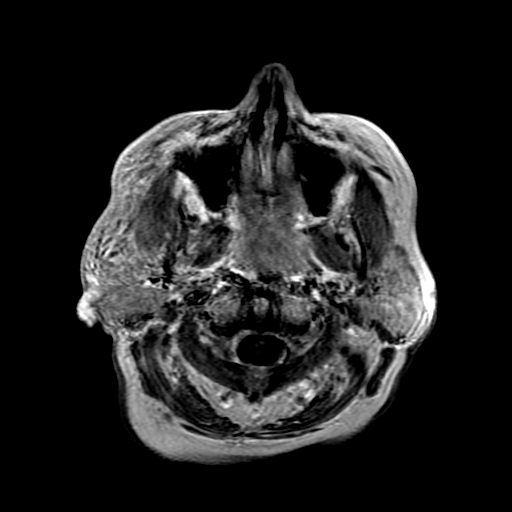
[im 26/26]
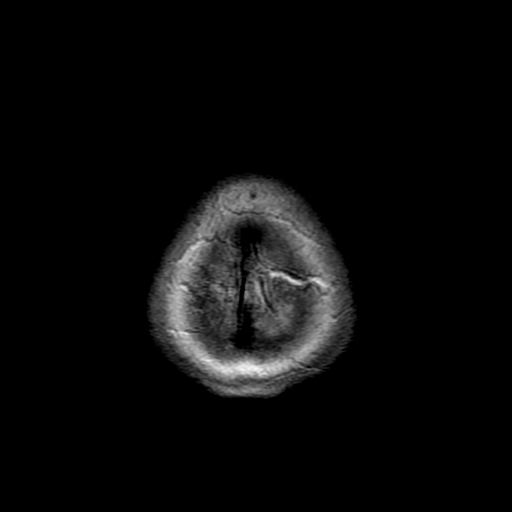

[Series 12: T1 · coronal · 5.0mm · 0.47mm/px · 1 of 27 slices shown (2 of 2)]
[im 1/27]
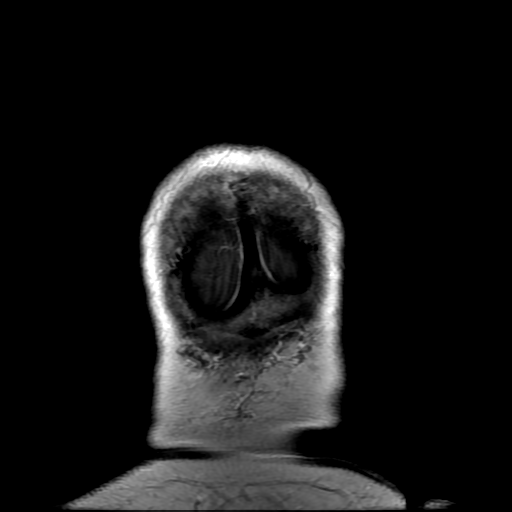

[Series 13: FLAIR post-contrast · sagittal · 5.0mm · 0.94mm/px · 2 of 28 slices shown]
[im 1/28]
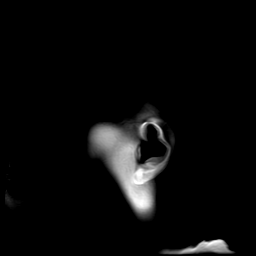
[im 28/28]
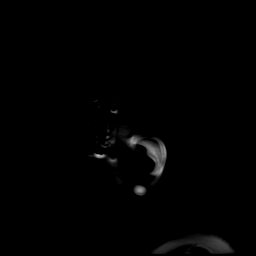

[Series 400: DWI · axial · 3.0mm · 0.94mm/px · z∈[-36,+99]mm · 4 of 47 slices shown (3 of 4)]
[im 1/47]
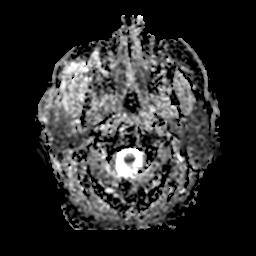
[im 16/47]
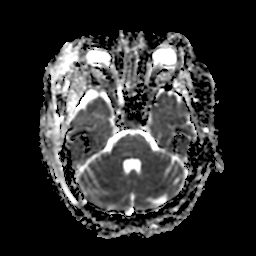
[im 31/47]
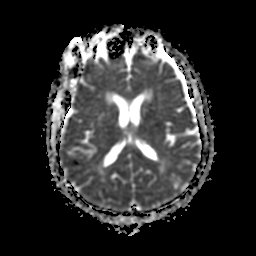
[im 47/47]
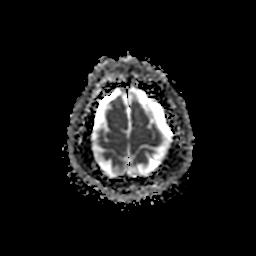

[Series 700: DWI · coronal · 5.0mm · 0.94mm/px · 3 of 32 slices shown (4 of 4)]
[im 1/32]
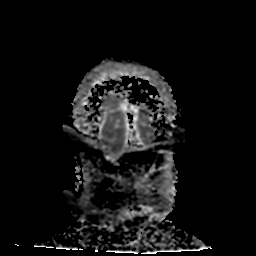
[im 16/32]
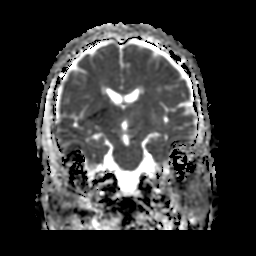
[im 32/32]
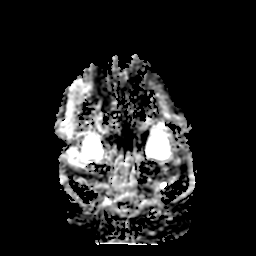

[33 of 48 positions shown; findings below may reference images not displayed]

FINDINGS: Sequelae of interval right pterional craniotomy are identified for
resection of the previously described suprasellar mass. There is no
acute infarct. Small volume pneumocephalus is present. There are
trace bilateral subdural fluid collections without associated mass
effect and mild diffuse dural enhancement, likely postoperative.
Lateral ventricles have decreased in size. Gas, swelling, and skin
staples are noted in the soft tissues at the craniotomy site.

Residual cystic and solid suprasellar/hypothalamic region mass
measures 1.6 x 2.2 x 1.6 cm (transverse by AP by craniocaudal,
previously 3.0 x 2.7 x 2.7 cm). The superior portion of the lesion
remains cystic with peripheral enhancement, with an approximately 10
mm focus of nodular solid enhancement at the anteroinferior aspect
of the mass. Local mass effect has greatly decreased, with mild
residual mass effect on the floor the third ventricle.

No new lesions are seen elsewhere. Patchy T2 hyperintensities in the
deep greater than subcortical cerebral white matter bilaterally are
again noted and nonspecific.

Orbits are unremarkable. No significant inflammatory disease is seen
in the paranasal sinuses or mastoid air cells. Major intracranial
vascular flow voids are preserved.
IMPRESSION: 1. Postoperative changes from interval debulking of cystic and solid
suprasellar mass as above.
2. Trace bilateral subdural fluid collections without mass effect.
3. No acute infarct

## 2016-08-21 ENCOUNTER — Ambulatory Visit: Admit: 2016-08-21 | Discharge: 2016-08-21 | Payer: MEDICARE | Attending: Urology | Primary: Family Medicine

## 2016-08-21 DIAGNOSIS — N2889 Other specified disorders of kidney and ureter: Secondary | ICD-10-CM

## 2016-08-21 NOTE — Progress Notes (Signed)
Mora ApplLawrence L Kasem Mozer, MD        08/21/2016 at 2:05 PM    UROLOGY INITIAL OFFICE  VISIT       PATIENT NAME: Elaine Davidson   DATE OF BIRTH: 12/02/1950     TODAY'S DATE: 08/21/2016    Chief Complaint:    Chief Complaint   Patient presents with   ??? Other     stage 3 kidney disease       HPI            Ms. Elaine Davidson  is a 65 y.o. female who presents with Right renal mass.  She is a patient of Dr.Negi and Dr Myles Giphatervedi.  She is being evaluated for renal insufficiency and proteinuria.  Her renal ultrasound suggested to complex cyst in the right kidney approximately 2.5 cm each.  She denies flank pain and urinary frequency urgency nocturia hematuria or infections.  She denies prior urologic surgery.        Review of Systems   Constitutional: Negative for chills, fever and unexpected weight change.   Cardiovascular: Negative for leg swelling.   Gastrointestinal: Negative for nausea and vomiting.   Genitourinary: Negative for difficulty urinating.   All other systems reviewed and are negative.        Past Medical History:    No past medical history on file.    Past Surgical History:    No past surgical history on file.    Current Medications:   Prior to Admission medications    Medication Sig Start Date End Date Taking? Authorizing Provider   carvedilol (COREG) 25 MG tablet Take 25 mg by mouth 07/20/16  Yes Historical Provider, MD   tiZANidine (ZANAFLEX) 4 MG tablet Take 4 mg by mouth 04/09/16  Yes Historical Provider, MD   amLODIPine (NORVASC) 5 MG tablet TAKE ONE TABLET BY MOUTH EVERY DAY 07/20/16  Yes Historical Provider, MD   metFORMIN (GLUCOPHAGE) 850 MG tablet TAKE ONE TABLET BY MOUTH TWO TIMES A DAY 07/20/16  Yes Historical Provider, MD        Allergies:  Garlic oil    Social History:  Social History     Social History   ??? Marital status: Single     Spouse name: N/A   ??? Number of children: N/A   ??? Years of education: N/A     Occupational History   ??? Not on file.     Social History Main Topics   ??? Smoking status: Current  Every Day Smoker     Packs/day: 0.50     Types: Cigarettes   ??? Smokeless tobacco: Never Used   ??? Alcohol use No   ??? Drug use: No   ??? Sexual activity: Not on file     Other Topics Concern   ??? Not on file     Social History Narrative   ??? No narrative on file       Family History:    No family history on file.    VITALS:  Ht 5\' 1"  (1.549 m)    Wt 210 lb (95.3 kg)    BMI 39.68 kg/m??   Physical Exam   Constitutional: She is oriented to person, place, and time. She appears well-developed and well-nourished.   HENT:   Head: Normocephalic and atraumatic.   Eyes: EOM are normal. Pupils are equal, round, and reactive to light.   Neck: Normal range of motion. No JVD present.   Cardiovascular: Regular rhythm.    Pulmonary/Chest: Effort normal.  No stridor. No respiratory distress.   Musculoskeletal: Normal range of motion. She exhibits no deformity.   Neurological: She is alert and oriented to person, place, and time.   Skin: Skin is dry. No pallor.   Psychiatric: Her behavior is normal. Judgment normal.         DATA:    LABS:  No results found for: PSAFREE, PSAFREEPCT  No results for input(s): PSAFREE, PSAFREEPCT in the last 72 hours.  No results found for: TESTOSTERONE  No results found for: WBC, HGB, HCT, MCV, PLT  No results found for: NA, K, CL, CO2, BUN, CREATININE, GLUCOSE, CALCIUM   No results found for: LABURIN    No results found for this visit on 08/21/16.      Radiology Review:           Impression/Plan   1. Right renal mass  - US Retroperitoneal Limited; Future     Return in about 6 months (around 02/19/2017).    Complex right renal mass likely mildly complex renal cyst.  There are small.  I recommend repeat ultrasound 6 months if they grow looked more suspicious it may be worth the risk to computed tomography scan with contrast but for now ultrasound 6 months will be adequate.      Mora ApplLawrence L Naaman Curro, MD   08/21/16   2:05 PM            No results found.

## 2016-11-28 ENCOUNTER — Other Ambulatory Visit (HOSPITAL_COMMUNITY): Payer: Self-pay | Admitting: Neurosurgery

## 2016-11-28 DIAGNOSIS — D444 Neoplasm of uncertain behavior of craniopharyngeal duct: Secondary | ICD-10-CM

## 2016-12-16 ENCOUNTER — Other Ambulatory Visit (HOSPITAL_COMMUNITY): Payer: Self-pay | Admitting: Neurosurgery

## 2016-12-16 ENCOUNTER — Other Ambulatory Visit: Payer: Self-pay | Admitting: Neurosurgery

## 2016-12-16 DIAGNOSIS — D444 Neoplasm of uncertain behavior of craniopharyngeal duct: Secondary | ICD-10-CM

## 2016-12-19 ENCOUNTER — Ambulatory Visit (HOSPITAL_COMMUNITY): Payer: Medicare Other

## 2016-12-24 ENCOUNTER — Other Ambulatory Visit: Payer: Self-pay | Admitting: Neurosurgery

## 2016-12-24 DIAGNOSIS — D444 Neoplasm of uncertain behavior of craniopharyngeal duct: Secondary | ICD-10-CM

## 2017-01-07 ENCOUNTER — Ambulatory Visit
Admission: RE | Admit: 2017-01-07 | Discharge: 2017-01-07 | Disposition: A | Discharge status: Home / Self Care | Payer: Medicare Other | Source: Ambulatory Visit | Attending: Neurosurgery | Admitting: Neurosurgery

## 2017-01-07 DIAGNOSIS — D444 Neoplasm of uncertain behavior of craniopharyngeal duct: Secondary | ICD-10-CM

## 2017-01-07 MED ORDER — GADOBENATE DIMEGLUMINE 529 MG/ML IV SOLN
10.0000 mL | Freq: Once | INTRAVENOUS | Status: AC | PRN
  Administered 2017-01-07: 8 mL via INTRAVENOUS

## 2017-02-10 ENCOUNTER — Inpatient Hospital Stay: Attending: Urology | Primary: Family Medicine

## 2017-02-19 ENCOUNTER — Ambulatory Visit: Admit: 2017-02-19 | Discharge: 2017-02-19 | Payer: MEDICARE | Attending: Urology | Primary: Family Medicine

## 2017-02-19 ENCOUNTER — Encounter: Attending: Urology | Primary: Family Medicine

## 2017-02-19 DIAGNOSIS — N2889 Other specified disorders of kidney and ureter: Secondary | ICD-10-CM

## 2017-02-19 LAB — POCT URINALYSIS DIPSTICK W/O MICROSCOPE (AUTO)
Bilirubin, UA: NEGATIVE
Glucose, UA POC: NEGATIVE
Ketones, UA: NEGATIVE
Leukocytes, UA: NEGATIVE
Nitrite, UA: NEGATIVE
Protein, UA POC: >=300
Spec Grav, UA: 1.02
Urobilinogen, UA: 0.2
pH, UA: 5.5

## 2017-02-19 NOTE — Progress Notes (Signed)
Brita Romp, MD      Office follow up          PATIENT NAME: Elaine Davidson   DATE OF BIRTH: 05-11-1951       TODAY'S DATE: 02/19/2017      IMPRESSION:    1. Right renal complex cyst  2. Renal insufficiency      PLAN:    1. Renal US now  2. Call her with results  3. If [place] stable see me one year      CHIEF COMPLAINT:  Chief Complaint   Patient presents with   ??? Other     f/u right renal mass       Subjective:   Elaine Davidson is a 66 y.o. female. Voiding well no heme or incontinence. No flank pain.      Review of Systems  Complete review of systems performed at pertinent positives in the HPI.    Medications  Prior to Admission medications    Medication Sig Start Date End Date Taking? Authorizing Provider   amLODIPine (NORVASC) 10 MG tablet  02/18/17  Yes Historical Provider, MD   Blood Glucose Monitoring Suppl (ONE TOUCH ULTRA 2) w/Device KIT USE AS DIRECTED 01/21/17  Yes Historical Provider, MD   DULoxetine (CYMBALTA) 30 MG extended release capsule Take 30 mg by mouth 01/13/17  Yes Historical Provider, MD   Memory Dance ELLIPTA 200-25 MCG/INH AEPB inhale 1 puff by mouth once a day as instructed 01/13/17  Yes Historical Provider, MD   furosemide (LASIX) 40 MG tablet Take 40 mg by mouth 01/13/17  Yes Historical Provider, MD   hydrOXYzine (ATARAX) 25 MG tablet TAKE ONE TABLET BY MOUTH EVERY 8 HOURS AS NEEDED 02/13/17  Yes Historical Provider, MD   lisinopril (PRINIVIL;ZESTRIL) 10 MG tablet  02/18/17  Yes Historical Provider, MD   pantoprazole (PROTONIX) 40 MG tablet Take 40 mg by mouth 01/13/17  Yes Historical Provider, MD   traZODone (DESYREL) 50 MG tablet TAKE ONE TABLET BY MOUTH EVERY DAY AT BEDTIME 02/05/17  Yes Historical Provider, MD   potassium chloride (KLOR-CON M) 20 MEQ extended release tablet Take 20 mEq by mouth 01/13/17  Yes Historical Provider, MD   carvedilol (COREG) 25 MG tablet Take 25 mg by mouth 07/20/16  Yes Historical Provider, MD   tiZANidine (ZANAFLEX) 4 MG tablet Take 4 mg by mouth 04/09/16  Yes Historical Provider,  MD   metFORMIN (GLUCOPHAGE) 850 MG tablet TAKE ONE TABLET BY MOUTH TWO TIMES A DAY 07/20/16  Yes Historical Provider, MD   pioglitazone (ACTOS) 15 MG tablet TAKE ONE TABLET BY MOUTH ONCE DAILY 02/12/17   Historical Provider, MD        Vitals:  BP (!) 175/95    Pulse 97    Resp 16    Ht 5' 1"  (1.549 m)    Wt 215 lb (97.5 kg)    BMI 40.62 kg/m??       Physical Exam  General: alert, appears stated age and cooperative   Abdomen: soft, non-tender, without masses or organomegaly    Back: CVA tenderness absent   GU: defer exam   ??        LABS:  No results found for: PSAFREE, PSAFREEPCT  No results for input(s): PSAFREE, PSAFREEPCT in the last 72 hours.  No results found for: TESTOSTERONE  No results found for: WBC, HGB, HCT, MCV, PLT  No results found for: NA, K, CL, CO2, BUN, CREATININE, GLUCOSE, CALCIUM   No results found for:  LABURIN    Results for POC orders placed in visit on 02/19/17   POCT Urinalysis No Micro (Auto)   Result Value Ref Range    Color, UA      Clarity, UA      Glucose, UA POC neg     Bilirubin, UA neg     Ketones, UA neg     Spec Grav, UA 1.020     Blood, UA POC small     pH, UA 5.5     Protein, UA POC >=300     Urobilinogen, UA 0.2     Leukocytes, UA neg     Nitrite, UA neg        Radiology Review:         1. Renal mass  - POCT Urinalysis No Micro (Auto)  - [place] Retroperitoneal Limited; Future     Return for renal US now, see me in one year.    Brita Romp, MD   02/19/17   12:08 PM

## 2017-02-21 ENCOUNTER — Inpatient Hospital Stay: Admit: 2017-02-21 | Attending: Urology | Primary: Family Medicine

## 2017-02-21 DIAGNOSIS — N2889 Other specified disorders of kidney and ureter: Secondary | ICD-10-CM

## 2017-02-21 NOTE — Other (Unsigned)
Patient Acct Nbr: [id number]   Primary AUTH/CERT:   Primary Insurance Company Name: [employer]  Primary Insurance Plan name: [facility]  Primary Insurance Group Number: [facility]  Primary Insurance Plan Type: Health  Primary Insurance Policy Number: [id number]    Secondary AUTH/CERT:   Secondary Insurance Company Name: [employer]  Secondary Insurance Plan name: [facility]  Secondary Insurance Group Number: [employer]  Secondary Insurance Plan Type: Health  Secondary Insurance Policy Number: [id number]

## 2017-02-24 NOTE — Telephone Encounter (Signed)
Pt was informed.

## 2017-02-24 NOTE — Telephone Encounter (Signed)
-----   Message from Mora ApplLawrence L Geller, MD sent at 02/24/2017 10:09 AM EDT -----  np has benign cyst lower pole left kidney

## 2017-07-10 NOTE — Other (Unsigned)
Patient Acct Nbr: [id number]   Primary AUTH/CERT:   Primary Insurance Company Name: [employer]  Primary Insurance Plan name: [facility]  Primary Insurance Group Number: [facility]  Primary Insurance Plan Type: Health  Primary Insurance Policy Number: [id number]    Secondary AUTH/CERT:   Secondary Insurance Company Name: [employer]  Secondary Insurance Plan name: [facility]  Secondary Insurance Group Number: [employer]  Secondary Insurance Plan Type: Health  Secondary Insurance Policy Number: [id number]

## 2018-02-11 ENCOUNTER — Other Ambulatory Visit: Payer: Self-pay | Admitting: Neurosurgery

## 2018-02-11 DIAGNOSIS — D444 Neoplasm of uncertain behavior of craniopharyngeal duct: Secondary | ICD-10-CM

## 2018-02-18 ENCOUNTER — Other Ambulatory Visit: Payer: Medicare Other

## 2018-02-19 ENCOUNTER — Ambulatory Visit
Admission: RE | Admit: 2018-02-19 | Discharge: 2018-02-19 | Disposition: A | Discharge status: Home / Self Care | Payer: Medicare Other | Source: Ambulatory Visit | Attending: Neurosurgery | Admitting: Neurosurgery

## 2018-02-19 DIAGNOSIS — D444 Neoplasm of uncertain behavior of craniopharyngeal duct: Secondary | ICD-10-CM

## 2018-02-19 MED ORDER — GADOBENATE DIMEGLUMINE 529 MG/ML IV SOLN
10.0000 mL | Freq: Once | INTRAVENOUS | Status: AC | PRN
  Administered 2018-02-19: 10 mL via INTRAVENOUS

## 2018-04-24 ENCOUNTER — Encounter: Attending: Urology | Primary: Family Medicine

## 2019-02-08 DEATH — deceased

## 2019-02-19 ENCOUNTER — Other Ambulatory Visit: Payer: Self-pay | Admitting: Neurosurgery

## 2019-02-19 DIAGNOSIS — D444 Neoplasm of uncertain behavior of craniopharyngeal duct: Secondary | ICD-10-CM

## 2019-03-04 ENCOUNTER — Other Ambulatory Visit: Payer: Self-pay | Admitting: Neurosurgery

## 2019-03-15 ENCOUNTER — Other Ambulatory Visit: Payer: Self-pay

## 2019-03-15 ENCOUNTER — Ambulatory Visit
Admission: RE | Admit: 2019-03-15 | Discharge: 2019-03-15 | Disposition: A | Discharge status: Home / Self Care | Payer: Medicare Other | Source: Ambulatory Visit | Attending: Neurosurgery | Admitting: Neurosurgery

## 2019-03-15 DIAGNOSIS — D444 Neoplasm of uncertain behavior of craniopharyngeal duct: Secondary | ICD-10-CM

## 2019-03-15 MED ORDER — GADOBENATE DIMEGLUMINE 529 MG/ML IV SOLN
7.0000 mL | Freq: Once | INTRAVENOUS | Status: AC | PRN
  Administered 2019-03-15: 7 mL via INTRAVENOUS

## 2019-03-17 ENCOUNTER — Other Ambulatory Visit: Payer: Self-pay | Admitting: Radiation Therapy

## 2020-03-08 ENCOUNTER — Encounter: Payer: Self-pay | Admitting: Radiation Therapy

## 2020-03-08 NOTE — Progress Notes (Signed)
Ms. Derrick has changed insurance and is no longer in network for any [place] provider. The policy she has changed to is [facility], [id number], group # [id number].          I called her neurosurgeon's office, Dr. Kathyrn Sheriff, to check on the upcoming brain MRI and follow-up appointments that are due. Becky, Dr. Cleotilde Neer secretary, informed me of the patient's insurance change and that she has been advised to find a new provider in [place] that is in-network.        Jodelle Green, one of the financial counselors in the radiation oncology department, has also checked to see if [facility] accepts this insurance. I was hoping that we could just do the scans and have her follow-up with Dr. Mickeal Skinner for a consistent continuum of care. Unfortunately, we are also out of network. The provider has to be based in [place] in order to see Ms. Alejo or to order procedures/exams.        Becky from [facility] has already shared this information with Ms. Tapper's son. The patient suffers from dementia and her son is her primary caregiver. He is not sure if he will be looking for any additional follow-up care based on his mother's current condition.   Mont Dutton R.T.(R)(T) Radiation Special Procedures Navigator

## 2020-05-10 DEATH — deceased
